# Patient Record
Sex: Female | Born: 1965 | Race: Black or African American | Hispanic: No | Marital: Single | Smoking: Former smoker
Health system: Southern US, Community
[De-identification: ages and names within clinical notes are randomized; demographics above are authoritative.]

## PROBLEM LIST (undated history)

## (undated) DIAGNOSIS — M1990 Unspecified osteoarthritis, unspecified site: Secondary | ICD-10-CM

## (undated) DIAGNOSIS — I499 Cardiac arrhythmia, unspecified: Secondary | ICD-10-CM

## (undated) DIAGNOSIS — R569 Unspecified convulsions: Secondary | ICD-10-CM

## (undated) DIAGNOSIS — E119 Type 2 diabetes mellitus without complications: Secondary | ICD-10-CM

## (undated) DIAGNOSIS — R51 Headache: Secondary | ICD-10-CM

## (undated) DIAGNOSIS — R200 Anesthesia of skin: Secondary | ICD-10-CM

## (undated) DIAGNOSIS — D329 Benign neoplasm of meninges, unspecified: Secondary | ICD-10-CM

## (undated) DIAGNOSIS — I10 Essential (primary) hypertension: Secondary | ICD-10-CM

## (undated) DIAGNOSIS — R519 Headache, unspecified: Secondary | ICD-10-CM

## (undated) DIAGNOSIS — G709 Myoneural disorder, unspecified: Secondary | ICD-10-CM

## (undated) HISTORY — PX: OTHER SURGICAL HISTORY: SHX169

---
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
## 2003-05-26 ENCOUNTER — Other Ambulatory Visit: Admission: RE | Admit: 2003-05-26 | Discharge: 2003-05-26 | Payer: Self-pay | Admitting: Obstetrics and Gynecology

## 2004-07-26 ENCOUNTER — Other Ambulatory Visit: Admission: RE | Admit: 2004-07-26 | Discharge: 2004-07-26 | Payer: Self-pay | Admitting: Obstetrics and Gynecology

## 2005-08-01 ENCOUNTER — Other Ambulatory Visit: Admission: RE | Admit: 2005-08-01 | Discharge: 2005-08-01 | Payer: Self-pay | Admitting: Obstetrics and Gynecology

## 2008-04-05 ENCOUNTER — Encounter: Admission: RE | Admit: 2008-04-05 | Discharge: 2008-04-05 | Payer: Self-pay | Admitting: Obstetrics and Gynecology

## 2009-08-29 ENCOUNTER — Encounter: Admission: RE | Admit: 2009-08-29 | Discharge: 2009-08-29 | Payer: Self-pay | Admitting: Obstetrics and Gynecology

## 2011-06-10 ENCOUNTER — Encounter (HOSPITAL_COMMUNITY): Payer: Self-pay

## 2011-06-18 ENCOUNTER — Other Ambulatory Visit: Payer: Self-pay

## 2011-06-18 ENCOUNTER — Ambulatory Visit (HOSPITAL_COMMUNITY)
Admission: RE | Admit: 2011-06-18 | Discharge: 2011-06-18 | Disposition: A | Discharge status: Home / Self Care | Payer: PRIVATE HEALTH INSURANCE | Source: Ambulatory Visit | Attending: Orthopedic Surgery | Admitting: Orthopedic Surgery

## 2011-06-18 ENCOUNTER — Encounter (HOSPITAL_COMMUNITY)
Admission: RE | Admit: 2011-06-18 | Discharge: 2011-06-18 | Disposition: A | Discharge status: Home / Self Care | Payer: PRIVATE HEALTH INSURANCE | Source: Ambulatory Visit | Attending: Orthopedic Surgery | Admitting: Orthopedic Surgery

## 2011-06-18 ENCOUNTER — Encounter (HOSPITAL_COMMUNITY): Payer: Self-pay

## 2011-06-18 DIAGNOSIS — Z01818 Encounter for other preprocedural examination: Secondary | ICD-10-CM | POA: Insufficient documentation

## 2011-06-18 DIAGNOSIS — Z01810 Encounter for preprocedural cardiovascular examination: Secondary | ICD-10-CM | POA: Insufficient documentation

## 2011-06-18 DIAGNOSIS — Z01812 Encounter for preprocedural laboratory examination: Secondary | ICD-10-CM | POA: Insufficient documentation

## 2011-06-18 HISTORY — DX: Essential (primary) hypertension: I10

## 2011-06-18 HISTORY — DX: Unspecified osteoarthritis, unspecified site: M19.90

## 2011-06-18 HISTORY — DX: Cardiac arrhythmia, unspecified: I49.9

## 2011-06-18 HISTORY — DX: Benign neoplasm of meninges, unspecified: D32.9

## 2011-06-18 LAB — URINALYSIS, ROUTINE W REFLEX MICROSCOPIC
Bilirubin Urine: NEGATIVE
Glucose, UA: NEGATIVE mg/dL
Hgb urine dipstick: NEGATIVE
Ketones, ur: NEGATIVE mg/dL
Specific Gravity, Urine: 1.025 (ref 1.005–1.030)
pH: 6.5 (ref 5.0–8.0)

## 2011-06-18 LAB — ABO/RH: ABO/RH(D): A POS

## 2011-06-18 LAB — DIFFERENTIAL
Basophils Absolute: 0 10*3/uL (ref 0.0–0.1)
Basophils Relative: 0 % (ref 0–1)
Eosinophils Absolute: 0.2 10*3/uL (ref 0.0–0.7)
Eosinophils Relative: 3 % (ref 0–5)
Lymphs Abs: 2.6 10*3/uL (ref 0.7–4.0)
Neutrophils Relative %: 47 % (ref 43–77)

## 2011-06-18 LAB — BASIC METABOLIC PANEL
Calcium: 9.1 mg/dL (ref 8.4–10.5)
Creatinine, Ser: 1.05 mg/dL (ref 0.50–1.10)
GFR calc non Af Amer: 63 mL/min — ABNORMAL LOW (ref >90)
Glucose, Bld: 101 mg/dL — ABNORMAL HIGH (ref 70–99)
Sodium: 134 mEq/L — ABNORMAL LOW (ref 135–145)

## 2011-06-18 LAB — CBC
MCH: 27.1 pg (ref 26.0–34.0)
Platelets: 247 10*3/uL (ref 150–400)
RBC: 4.61 MIL/uL (ref 3.87–5.11)
RDW: 14 % (ref 11.5–15.5)

## 2011-06-18 LAB — PROTIME-INR
INR: 0.97 (ref 0.00–1.49)
Prothrombin Time: 13.1 seconds (ref 11.6–15.2)

## 2011-06-18 LAB — SURGICAL PCR SCREEN: Staphylococcus aureus: NEGATIVE

## 2011-06-18 NOTE — Patient Instructions (Signed)
20 [name]  06/18/2011   Your procedure is scheduled on:  Monday  2/11  AT 7:30 AM  Report to [facility] at 5:30 AM.  Call this number if you have problems the morning of surgery: [phone]   Remember:   Do not eat food OR DRINK ANYTHING AFTER MIDNIGHT THE NIGHT BEFORE YOUR SURGERY.      Take these medicines the morning of surgery with A SIP OF WATER: NO MEDS TO TAKE   Do not wear jewelry, make-up or nail polish.  Do not wear lotions, powders, or perfumes.   Do not shave 48 hours prior to surgery.  Do not bring valuables to the hospital.  Contacts, dentures or bridgework may not be worn into surgery.  Leave suitcase in the car. After surgery it may be brought to your room.  For patients admitted to the hospital, checkout time is 11:00 AM the day of discharge.   Patients discharged the day of surgery will not be allowed to drive home.    Special Instructions: CHG Shower Use Special Wash: 1/2 bottle night before surgery and 1/2 bottle morning of surgery.   Please read over the following fact sheets that you were given: Blood Transfusion Information and MRSA Information AND INCENTIVE SPIROMETER INFORMATION

## 2011-06-18 NOTE — Pre-Procedure Instructions (Signed)
PT'S HEART RATE 39 AND 40 WITH DYNAMAP--AND BY RADIAL PULSE AND IRREGULAR.  PT STATES HX OF EXTRA HEARTBEAT AND SENT TO CARDIOLOGIST--NOT SURE WHEN--DID TREADMILL STRESS TEST--PT DOES NOT KNOW ANY OTHER INFORMATION--DOES NOT SEE CARDIOLOGIST ANYMORE.  EKG WAS DONE TODAY AT [facility] RATE 69 AND BIGEMINY,  B/P 156/90--PT STATES HER B/P HAS BEEN ELEVATED HER LAST 3 DOCTOR VISITS BUT SHE IS NOT ON ANY B/P MEDICATION. DR. Agustin Cree NOTIFIED OF ABOVE--STATES PT NEEDS CARDIAC CLEARANCE. [facility]-SURGERY SCHEDULER FOR DR. OLIN NOTIFIED OF ABOVE--SHE WILL LET DR. OLIN KNOW PT NEEDS CARDIAC CLEARANCE.  PT'S EKG REPORT FAXED TO [facility]. SPOKE WITH MEDICAL RECORDS PERSON AT DR. RIVARD'S OFFICE--RECORDS INDICATE PT WAS SENT TO [facility] IN 2006--IF WE NEED THOSE RECORDS-WE WOULD NEED TO CONTACT [facility].

## 2011-06-20 ENCOUNTER — Encounter: Payer: Self-pay | Admitting: *Deleted

## 2011-06-20 ENCOUNTER — Encounter: Payer: Self-pay | Admitting: Cardiovascular Disease

## 2011-06-21 ENCOUNTER — Ambulatory Visit (INDEPENDENT_AMBULATORY_CARE_PROVIDER_SITE_OTHER): Payer: PRIVATE HEALTH INSURANCE | Admitting: Cardiovascular Disease

## 2011-06-21 DIAGNOSIS — I493 Ventricular premature depolarization: Secondary | ICD-10-CM | POA: Insufficient documentation

## 2011-06-21 DIAGNOSIS — M25561 Pain in right knee: Secondary | ICD-10-CM | POA: Insufficient documentation

## 2011-06-21 DIAGNOSIS — I4949 Other premature depolarization: Secondary | ICD-10-CM

## 2011-06-21 DIAGNOSIS — M25569 Pain in unspecified knee: Secondary | ICD-10-CM

## 2011-06-21 DIAGNOSIS — Z01810 Encounter for preprocedural cardiovascular examination: Secondary | ICD-10-CM

## 2011-06-21 NOTE — Pre-Procedure Instructions (Signed)
OFFICE NOTE FROM CARDIOLOGIST DR. Eden Emms 06/21/11 IN EPIC-GIVING CLEARANCE FOR PT TO HAVE HER KNEE SURGERY WITH DR. OLIN ON 2/11.

## 2011-06-21 NOTE — Patient Instructions (Signed)
Your physician recommends that you schedule a follow-up appointment in: AS NEEDED  Your physician recommends that you continue on your current medications as directed. Please refer to the Current Medication list given to you today.  

## 2011-06-21 NOTE — Assessment & Plan Note (Signed)
Benign Normal ETT and echo per patient 3 years ago.  Asymptomatic.  Underlying exam and ECG normal.  Clinical fact that they go away with activity is reassuring

## 2011-06-21 NOTE — Assessment & Plan Note (Signed)
Clear to have TKR with Dr Charlann Boxer. Consider telemetry post op  We would be happy to follow in hospital if needed

## 2011-06-21 NOTE — Progress Notes (Signed)
46 yo referred for preop clearence.  Right TKR scheduled with Dr Charlann Boxer on Monday.  ECG with PVC;s.  Patient has long standing history of PVC;s and irregular heart beat.  ETT and Echo normal in [place] 3 years ago.  Indicates skips at rest that go away with exercise.  No history of SSCP, syncope, or dyspnea.  Hurt knee in 85 playing sports and now is severely limited.  Labs done 06/18/11 reviewed and normal  ROS: Denies fever, malais, weight loss, blurry vision, decreased visual acuity, cough, sputum, SOB, hemoptysis, pleuritic pain, palpitaitons, heartburn, abdominal pain, melena, lower extremity edema, claudication, or rash.  All other systems reviewed and negative   General: Affect appropriate Overweight black female HEENT: normal Neck supple with no adenopathy JVP normal no bruits no thyromegaly Lungs clear with no wheezing and good diaphragmatic motion Heart:  S1/S2 no murmur,rub, gallop or click PMI normal Abdomen: benighn, BS positve, no tenderness, no AAA no bruit.  No HSM or HJR Distal pulses intact with no bruits No edema Neuro non-focal Skin warm and dry No muscular weakness  Medications Current Outpatient Prescriptions  Medication Sig Dispense Refill  . Biotin 1000 MCG tablet Take 1,000 mcg by mouth daily.      Marland Kitchen BROMOCRIPTINE MESYLATE PO Take 1 tablet by mouth daily. AT NIGHT      . Cyanocobalamin (VITAMIN B-12) 1000 MCG/15ML LIQD Take 0.5 mLs by mouth daily.      Marland Kitchen FOLIC ACID PO Take 1 tablet by mouth daily.      . Multiple Vitamin (MULITIVITAMIN WITH MINERALS) TABS Take 1 tablet by mouth daily. One-a-Day Mind Active.      . naproxen sodium (ANAPROX) 220 MG tablet Take 440 mg by mouth as needed. For pain.        Allergies Review of patient's allergies indicates no known allergies.  Family History: No family history on file.  Social History: History   Social History  . Marital Status: Single    Spouse Name: N/A    Number of Children: N/A  . Years of Education:  N/A   Occupational History  . Not on file.   Social History Main Topics  . Smoking status: Former Games developer  . Smokeless tobacco: Never Used   Comment: QUIT 3 YEARS AGO-ABOUT 2008--SOCIAL SMOKER ONLY  . Alcohol Use: No  . Drug Use: No  . Sexually Active:    Other Topics Concern  . Not on file   Social History Narrative  . No narrative on file    Electrocardiogram: Sinus rhythm periods of bigeminy underlying ECG normal  Assessment and Plan

## 2011-06-21 NOTE — Assessment & Plan Note (Signed)
Clear to have surgery on Monday with no further testing

## 2011-06-23 NOTE — H&P (Signed)
[name] is an 46 y.o. female.    Chief Complaint:  Right knee pain and medial aspect OA   HPI: Pt is a 46 y.o. female complaining of right knee pain for over 2 years. Pain has continually increased since the beginning, especially the last couple of months.. X-rays in the clinic show end-stage arthritic changes of the medial aspect of the right knee. Pt has tried various conservative treatments which have failed to alleviate their symptoms, including NSAIDs. Various options are discussed with the patient. Risks, benefits and expectations were discussed with the patient. Patient understand the risks, benefits and expectations and wishes to proceed with surgery.   PCP:  No primary provider on file.  D/C Plans:  Home with HHPT  Post-op Meds:  Rx given for ASA, Robaxin, Celebrex, Iron, Colace and MiraLax  Tranexamic Acid:  To be given  Decadron:  To be given   PMH: Past Medical History  Diagnosis Date  . 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 Chrissie Noa CABELL IN  ONCE A YEAR  . Hypertension     B/P ELEVATED LAST 3 DOCTOR VISITS--BUT NO PRIOR HX AND NOT ON B/P MEDS  . Arthritis     PAIN AND OA RIGHT KNEE--ALSO IN LEFT KNEE--RIGHT PAIN  WORSE  . Dysrhythmia     PT STATES SHE HAS AN EXTRA HEART BEAT AT TIMES--FIRST NOTICED BY HER GYN  DR. Estanislado Pandy COUPLE OF YRS AGO--PT STATES SHE WAS SENT FOR STRESS TEST-TREADMILL.  -PT DOESN'T HAVE ANY OTHER INFORMATION.    PSH: Past Surgical History  Procedure Date  . Cesarean section 06/1990  . Cyst removed both hands 1997 OR 1998  . Right knee arthroscopy 1984    Social History:  reports that she has quit smoking. She has never used smokeless tobacco. She reports that she does not drink alcohol or use illicit drugs.  Allergies:  No Known Allergies  Medications: No current facility-administered  medications for this encounter.   Current Outpatient Prescriptions  Medication Sig Dispense Refill  . Biotin 1000 MCG tablet Take 1,000 mcg by mouth daily.      Marland Kitchen BROMOCRIPTINE MESYLATE PO Take 1 tablet by mouth daily. AT NIGHT      . Cyanocobalamin (VITAMIN B-12) 1000 MCG/15ML LIQD Take 0.5 mLs by mouth daily.      Marland Kitchen FOLIC ACID PO Take 1 tablet by mouth daily.      . Multiple Vitamin (MULITIVITAMIN WITH MINERALS) TABS Take 1 tablet by mouth daily. One-a-Day Mind Active.      . naproxen sodium (ANAPROX) 220 MG tablet Take 440 mg by mouth as needed. For pain.        ROS: Review of Systems  Constitutional: Negative.   HENT: Negative.   Eyes: Negative.   Respiratory: Negative.   Cardiovascular: Negative.   Gastrointestinal: Negative.   Genitourinary: Negative.   Musculoskeletal: Positive for joint pain.  Skin: Negative.   Neurological: Negative.   Endo/Heme/Allergies: Negative.   Psychiatric/Behavioral: Negative.      Physican Exam: Physical Exam  Constitutional: She is oriented to person, place, and time and well-developed, well-nourished, and in no distress.  HENT:  Head: Normocephalic and atraumatic.  Nose: Nose normal.  Mouth/Throat: Oropharynx is clear and moist.  Eyes: Pupils are equal, round, and reactive to light.  Neck: Neck supple. No JVD present. No tracheal deviation present. No thyromegaly present.  Cardiovascular:  Normal rate.   No murmur (third beat involved, has had for most f her life and been checkout previously for it) heard. Pulmonary/Chest: Effort normal and breath sounds normal. No stridor. No respiratory distress. She has no wheezes. She has no rales. She exhibits no tenderness.  Abdominal: Soft. There is no tenderness. There is no guarding.  Musculoskeletal:       Right knee: She exhibits decreased range of motion, swelling and bony tenderness. She exhibits no effusion, no ecchymosis, no deformity, no laceration and no erythema. tenderness found.    Lymphadenopathy:    She has no cervical adenopathy.  Neurological: She is alert and oriented to person, place, and time.  Skin: Skin is warm and dry.  Psychiatric: Affect normal.     Assessment/Plan Assessment:  Right knee pain and medial aspect OA   Plan: Patient will undergo a right knee medial unilateral knee replacement on 06/24/2011. Risks benefits and expectation were discussed with the patient. Patient understand risks, benefits and expectation and wishes to proceed.   Anastasio Auerbach Tahsin Benyo   PAC  06/23/2011, 5:02 PM

## 2011-06-24 ENCOUNTER — Encounter (HOSPITAL_COMMUNITY): Payer: Self-pay | Admitting: Certified Registered Nurse Anesthetist

## 2011-06-24 ENCOUNTER — Encounter (HOSPITAL_COMMUNITY)
Admission: RE | Disposition: A | Discharge status: Home / Self Care | Payer: Self-pay | Source: Ambulatory Visit | Attending: Orthopedic Surgery

## 2011-06-24 ENCOUNTER — Observation Stay (HOSPITAL_COMMUNITY)
Admission: RE | Admit: 2011-06-24 | Discharge: 2011-06-25 | Disposition: A | Discharge status: Home / Self Care | Payer: PRIVATE HEALTH INSURANCE | Source: Ambulatory Visit | Attending: Orthopedic Surgery | Admitting: Orthopedic Surgery

## 2011-06-24 ENCOUNTER — Encounter (HOSPITAL_COMMUNITY): Payer: Self-pay

## 2011-06-24 ENCOUNTER — Inpatient Hospital Stay (HOSPITAL_COMMUNITY): Payer: PRIVATE HEALTH INSURANCE | Admitting: Certified Registered Nurse Anesthetist

## 2011-06-24 DIAGNOSIS — I10 Essential (primary) hypertension: Secondary | ICD-10-CM | POA: Insufficient documentation

## 2011-06-24 DIAGNOSIS — M1710 Unilateral primary osteoarthritis, unspecified knee: Principal | ICD-10-CM | POA: Insufficient documentation

## 2011-06-24 DIAGNOSIS — I499 Cardiac arrhythmia, unspecified: Secondary | ICD-10-CM | POA: Insufficient documentation

## 2011-06-24 DIAGNOSIS — D320 Benign neoplasm of cerebral meninges: Secondary | ICD-10-CM | POA: Insufficient documentation

## 2011-06-24 DIAGNOSIS — Z79899 Other long term (current) drug therapy: Secondary | ICD-10-CM | POA: Insufficient documentation

## 2011-06-24 DIAGNOSIS — M25569 Pain in unspecified knee: Secondary | ICD-10-CM | POA: Insufficient documentation

## 2011-06-24 DIAGNOSIS — Z96651 Presence of right artificial knee joint: Secondary | ICD-10-CM

## 2011-06-24 HISTORY — PX: PARTIAL KNEE ARTHROPLASTY: SHX2174

## 2011-06-24 LAB — TYPE AND SCREEN

## 2011-06-24 SURGERY — ARTHROPLASTY, KNEE, UNICOMPARTMENTAL
Anesthesia: Spinal | Site: Knee | Laterality: Right | Wound class: Clean

## 2011-06-24 MED ORDER — FLEET ENEMA 7-19 GM/118ML RE ENEM: 1.0000 | ENEMA | Freq: Once | RECTAL | Status: AC | PRN

## 2011-06-24 MED ORDER — ATROPINE SULFATE 0.4 MG/ML IJ SOLN
INTRAMUSCULAR | Status: DC | PRN
  Administered 2011-06-24: 0.4 mg via INTRAVENOUS

## 2011-06-24 MED ORDER — BUPIVACAINE IN DEXTROSE 0.75-8.25 % IT SOLN
INTRATHECAL | Status: DC | PRN
  Administered 2011-06-24: 2 mL via INTRATHECAL

## 2011-06-24 MED ORDER — ONDANSETRON HCL 4 MG PO TABS
4.0000 mg | ORAL_TABLET | Freq: Four times a day (QID) | ORAL | Status: DC | PRN
  Administered 2011-06-25: 4 mg via ORAL
  Filled 2011-06-24: qty 1

## 2011-06-24 MED ORDER — RIVAROXABAN 10 MG PO TABS: 10.0000 mg | ORAL_TABLET | Freq: Every day | ORAL | Status: DC

## 2011-06-24 MED ORDER — DEXAMETHASONE SODIUM PHOSPHATE 10 MG/ML IJ SOLN
10.0000 mg | Freq: Once | INTRAMUSCULAR | Status: AC
  Administered 2011-06-24: 10 mg via INTRAVENOUS

## 2011-06-24 MED ORDER — FENTANYL CITRATE 0.05 MG/ML IJ SOLN
INTRAMUSCULAR | Status: DC | PRN
  Administered 2011-06-24 (×5): 50 ug via INTRAVENOUS

## 2011-06-24 MED ORDER — TRANEXAMIC ACID 100 MG/ML IV SOLN
15.0000 mg/kg | Freq: Once | INTRAVENOUS | Status: AC
  Administered 2011-06-24: 1605 mg via INTRAVENOUS
  Filled 2011-06-24: qty 16.05

## 2011-06-24 MED ORDER — PHENOL 1.4 % MT LIQD
1.0000 | OROMUCOSAL | Status: DC | PRN
  Filled 2011-06-24: qty 177

## 2011-06-24 MED ORDER — KETOROLAC TROMETHAMINE 30 MG/ML IJ SOLN
INTRAMUSCULAR | Status: DC | PRN
  Administered 2011-06-24: 30 mg

## 2011-06-24 MED ORDER — LIDOCAINE HCL (CARDIAC) 20 MG/ML IV SOLN
INTRAVENOUS | Status: DC | PRN
  Administered 2011-06-24: 80 mg via INTRAVENOUS

## 2011-06-24 MED ORDER — ONDANSETRON HCL 4 MG/2ML IJ SOLN
4.0000 mg | Freq: Four times a day (QID) | INTRAMUSCULAR | Status: DC | PRN
  Filled 2011-06-24: qty 2

## 2011-06-24 MED ORDER — FERROUS SULFATE 325 (65 FE) MG PO TABS
325.0000 mg | ORAL_TABLET | Freq: Three times a day (TID) | ORAL | Status: DC
  Administered 2011-06-24 – 2011-06-25 (×2): 325 mg via ORAL
  Filled 2011-06-24 (×4): qty 1

## 2011-06-24 MED ORDER — ZOLPIDEM TARTRATE 5 MG PO TABS: 5.0000 mg | ORAL_TABLET | Freq: Every evening | ORAL | Status: DC | PRN

## 2011-06-24 MED ORDER — MENTHOL 3 MG MT LOZG
1.0000 | LOZENGE | OROMUCOSAL | Status: DC | PRN
  Filled 2011-06-24: qty 9

## 2011-06-24 MED ORDER — DOCUSATE SODIUM 100 MG PO CAPS
100.0000 mg | ORAL_CAPSULE | Freq: Two times a day (BID) | ORAL | Status: DC
  Administered 2011-06-24: 100 mg via ORAL
  Filled 2011-06-24 (×3): qty 1

## 2011-06-24 MED ORDER — 0.9 % SODIUM CHLORIDE (POUR BTL) OPTIME
TOPICAL | Status: DC | PRN
  Administered 2011-06-24: 1000 mL

## 2011-06-24 MED ORDER — CEFAZOLIN SODIUM-DEXTROSE 2-3 GM-% IV SOLR
2.0000 g | Freq: Four times a day (QID) | INTRAVENOUS | Status: AC
  Administered 2011-06-24 – 2011-06-25 (×3): 2 g via INTRAVENOUS
  Filled 2011-06-24 (×3): qty 50

## 2011-06-24 MED ORDER — BISACODYL 5 MG PO TBEC: 5.0000 mg | DELAYED_RELEASE_TABLET | Freq: Every day | ORAL | Status: DC | PRN

## 2011-06-24 MED ORDER — HYDROMORPHONE HCL PF 1 MG/ML IJ SOLN: 0.2500 mg | INTRAMUSCULAR | Status: DC | PRN

## 2011-06-24 MED ORDER — GLYCOPYRROLATE 0.2 MG/ML IJ SOLN
INTRAMUSCULAR | Status: DC | PRN
  Administered 2011-06-24: 0.2 mg via INTRAVENOUS

## 2011-06-24 MED ORDER — METHOCARBAMOL 100 MG/ML IJ SOLN
500.0000 mg | Freq: Four times a day (QID) | INTRAMUSCULAR | Status: DC | PRN
  Administered 2011-06-24: 500 mg via INTRAVENOUS
  Filled 2011-06-24: qty 5

## 2011-06-24 MED ORDER — HYDROMORPHONE HCL PF 1 MG/ML IJ SOLN
0.5000 mg | INTRAMUSCULAR | Status: DC | PRN
  Administered 2011-06-24 (×3): 0.5 mg via INTRAVENOUS
  Filled 2011-06-24 (×3): qty 1

## 2011-06-24 MED ORDER — LACTATED RINGERS IV SOLN
INTRAVENOUS | Status: DC | PRN
  Administered 2011-06-24 (×2): via INTRAVENOUS

## 2011-06-24 MED ORDER — PHENYLEPHRINE HCL 10 MG/ML IJ SOLN
INTRAMUSCULAR | Status: DC | PRN
  Administered 2011-06-24: 20 ug via INTRAVENOUS
  Administered 2011-06-24: 10 ug via INTRAVENOUS

## 2011-06-24 MED ORDER — ACETAMINOPHEN 10 MG/ML IV SOLN
INTRAVENOUS | Status: DC | PRN
  Administered 2011-06-24: 1000 mg via INTRAVENOUS

## 2011-06-24 MED ORDER — PROMETHAZINE HCL 25 MG/ML IJ SOLN: 6.2500 mg | INTRAMUSCULAR | Status: DC | PRN

## 2011-06-24 MED ORDER — POLYETHYLENE GLYCOL 3350 17 G PO PACK
17.0000 g | PACK | Freq: Two times a day (BID) | ORAL | Status: DC
  Administered 2011-06-24: 17 g via ORAL
  Filled 2011-06-24 (×3): qty 1

## 2011-06-24 MED ORDER — CEFAZOLIN SODIUM-DEXTROSE 2-3 GM-% IV SOLR
2.0000 g | Freq: Once | INTRAVENOUS | Status: AC
  Administered 2011-06-24: 2 g via INTRAVENOUS

## 2011-06-24 MED ORDER — HYDROCODONE-ACETAMINOPHEN 7.5-325 MG PO TABS
1.0000 | ORAL_TABLET | ORAL | Status: DC
  Administered 2011-06-24 (×2): 2 via ORAL
  Administered 2011-06-24: 1 via ORAL
  Administered 2011-06-25 (×3): 2 via ORAL
  Filled 2011-06-24 (×4): qty 2
  Filled 2011-06-24: qty 1
  Filled 2011-06-24: qty 2

## 2011-06-24 MED ORDER — METHOCARBAMOL 500 MG PO TABS
500.0000 mg | ORAL_TABLET | Freq: Four times a day (QID) | ORAL | Status: DC | PRN
  Administered 2011-06-24 – 2011-06-25 (×2): 500 mg via ORAL
  Filled 2011-06-24 (×2): qty 1

## 2011-06-24 MED ORDER — METOCLOPRAMIDE HCL 10 MG PO TABS: 5.0000 mg | ORAL_TABLET | Freq: Three times a day (TID) | ORAL | Status: DC | PRN

## 2011-06-24 MED ORDER — ACETAMINOPHEN 325 MG PO TABS: 650.0000 mg | ORAL_TABLET | Freq: Four times a day (QID) | ORAL | Status: DC | PRN

## 2011-06-24 MED ORDER — RIVAROXABAN 10 MG PO TABS
10.0000 mg | ORAL_TABLET | Freq: Once | ORAL | Status: AC
  Administered 2011-06-25: 10 mg via ORAL
  Filled 2011-06-24: qty 1

## 2011-06-24 MED ORDER — CHLORHEXIDINE GLUCONATE 4 % EX LIQD: 60.0000 mL | Freq: Once | CUTANEOUS | Status: DC

## 2011-06-24 MED ORDER — MIDAZOLAM HCL 5 MG/5ML IJ SOLN
INTRAMUSCULAR | Status: DC | PRN
  Administered 2011-06-24: 2 mg via INTRAVENOUS

## 2011-06-24 MED ORDER — DIPHENHYDRAMINE HCL 25 MG PO CAPS: 25.0000 mg | ORAL_CAPSULE | Freq: Four times a day (QID) | ORAL | Status: DC | PRN

## 2011-06-24 MED ORDER — SODIUM CHLORIDE 0.9 % IV SOLN
INTRAVENOUS | Status: DC
  Administered 2011-06-24 (×2): via INTRAVENOUS
  Filled 2011-06-24 (×6): qty 1000

## 2011-06-24 MED ORDER — RIVAROXABAN 10 MG PO TABS
10.0000 mg | ORAL_TABLET | ORAL | Status: DC
  Filled 2011-06-24: qty 1

## 2011-06-24 MED ORDER — LACTATED RINGERS IV SOLN: INTRAVENOUS | Status: DC

## 2011-06-24 MED ORDER — ACETAMINOPHEN 650 MG RE SUPP: 650.0000 mg | Freq: Four times a day (QID) | RECTAL | Status: DC | PRN

## 2011-06-24 MED ORDER — BUPIVACAINE-EPINEPHRINE 0.25% -1:200000 IJ SOLN
INTRAMUSCULAR | Status: DC | PRN
  Administered 2011-06-24: 30 mL

## 2011-06-24 MED ORDER — METOCLOPRAMIDE HCL 5 MG/ML IJ SOLN: 5.0000 mg | Freq: Three times a day (TID) | INTRAMUSCULAR | Status: DC | PRN

## 2011-06-24 MED ORDER — PROPOFOL 10 MG/ML IV EMUL
INTRAVENOUS | Status: DC | PRN
  Administered 2011-06-24: 25 ug/kg/min via INTRAVENOUS

## 2011-06-24 MED ORDER — DEXAMETHASONE SODIUM PHOSPHATE 10 MG/ML IJ SOLN
10.0000 mg | Freq: Once | INTRAMUSCULAR | Status: AC
  Administered 2011-06-25: 10 mg via INTRAVENOUS
  Filled 2011-06-24: qty 1

## 2011-06-24 MED ORDER — ALUM & MAG HYDROXIDE-SIMETH 200-200-20 MG/5ML PO SUSP: 30.0000 mL | ORAL | Status: DC | PRN

## 2011-06-24 SURGICAL SUPPLY — 56 items
ADH SKN CLS APL DERMABOND .7 (GAUZE/BANDAGES/DRESSINGS) ×1
BAG SPEC THK2 15X12 ZIP CLS (MISCELLANEOUS) ×1
BAG ZIPLOCK 12X15 (MISCELLANEOUS) ×2 IMPLANT
BANDAGE ELASTIC 6 VELCRO ST LF (GAUZE/BANDAGES/DRESSINGS) ×2 IMPLANT
BANDAGE ESMARK 6X9 LF (GAUZE/BANDAGES/DRESSINGS) ×1 IMPLANT
BLADE SAW RECIPROCATING 77.5 (BLADE) ×2 IMPLANT
BLADE SAW SGTL 13.0X1.19X90.0M (BLADE) ×2 IMPLANT
BNDG CMPR 9X6 STRL LF SNTH (GAUZE/BANDAGES/DRESSINGS) ×1
BNDG ESMARK 6X9 LF (GAUZE/BANDAGES/DRESSINGS) ×2
BOWL SMART MIX CTS (DISPOSABLE) ×2 IMPLANT
CEMENT HV SMART SET (Cement) ×2 IMPLANT
CLOTH BEACON ORANGE TIMEOUT ST (SAFETY) ×2 IMPLANT
COVER SURGICAL LIGHT HANDLE (MISCELLANEOUS) ×2 IMPLANT
CUFF TOURN SGL QUICK 34 (TOURNIQUET CUFF) ×2
CUFF TRNQT CYL 34X4X40X1 (TOURNIQUET CUFF) ×1 IMPLANT
DERMABOND ADVANCED (GAUZE/BANDAGES/DRESSINGS) ×1
DERMABOND ADVANCED .7 DNX12 (GAUZE/BANDAGES/DRESSINGS) ×1 IMPLANT
DRAPE EXTREMITY T 121X128X90 (DRAPE) ×2 IMPLANT
DRAPE POUCH INSTRU U-SHP 10X18 (DRAPES) ×2 IMPLANT
DRSG AQUACEL AG ADV 3.5X 6 (GAUZE/BANDAGES/DRESSINGS) ×2 IMPLANT
DRSG AQUACEL AG ADV 3.5X14 (GAUZE/BANDAGES/DRESSINGS) ×1 IMPLANT
DRSG TEGADERM 4X4.75 (GAUZE/BANDAGES/DRESSINGS) ×2 IMPLANT
DURAPREP 26ML APPLICATOR (WOUND CARE) ×2 IMPLANT
ELECT REM PT RETURN 9FT ADLT (ELECTROSURGICAL) ×2
ELECTRODE REM PT RTRN 9FT ADLT (ELECTROSURGICAL) ×1 IMPLANT
EVACUATOR 1/8 PVC DRAIN (DRAIN) ×2 IMPLANT
FACESHIELD LNG OPTICON STERILE (SAFETY) ×8 IMPLANT
GAUZE SPONGE 2X2 8PLY STRL LF (GAUZE/BANDAGES/DRESSINGS) ×1 IMPLANT
GLOVE BIOGEL PI IND STRL 7.5 (GLOVE) ×1 IMPLANT
GLOVE BIOGEL PI IND STRL 8 (GLOVE) IMPLANT
GLOVE BIOGEL PI INDICATOR 7.5 (GLOVE) ×1
GLOVE BIOGEL PI INDICATOR 8 (GLOVE)
GLOVE INDICATOR 6.5 STRL GRN (GLOVE) ×1 IMPLANT
GLOVE ORTHO TXT STRL SZ7.5 (GLOVE) ×4 IMPLANT
GLOVE SURG SS PI 6.5 STRL IVOR (GLOVE) ×1 IMPLANT
GLOVE SURG SS PI 8.5 STRL IVOR (GLOVE) ×2
GLOVE SURG SS PI 8.5 STRL STRW (GLOVE) IMPLANT
GOWN BRE IMP PREV XXLGXLNG (GOWN DISPOSABLE) ×3 IMPLANT
GOWN STRL NON-REIN LRG LVL3 (GOWN DISPOSABLE) ×3 IMPLANT
IMMOBILIZER KNEE 20 (SOFTGOODS) ×2
IMMOBILIZER KNEE 20 THIGH 36 (SOFTGOODS) IMPLANT
KIT BASIN OR (CUSTOM PROCEDURE TRAY) ×2 IMPLANT
MANIFOLD NEPTUNE II (INSTRUMENTS) ×2 IMPLANT
NDL SAFETY ECLIPSE 18X1.5 (NEEDLE) ×1 IMPLANT
NEEDLE HYPO 18GX1.5 SHARP (NEEDLE) ×2
PACK TOTAL JOINT (CUSTOM PROCEDURE TRAY) ×2 IMPLANT
POSITIONER SURGICAL ARM (MISCELLANEOUS) ×2 IMPLANT
SPONGE GAUZE 2X2 STER 10/PKG (GAUZE/BANDAGES/DRESSINGS) ×1
SUCTION FRAZIER TIP 10 FR DISP (SUCTIONS) ×2 IMPLANT
SUT MNCRL AB 4-0 PS2 18 (SUTURE) ×2 IMPLANT
SUT VIC AB 1 CT1 36 (SUTURE) ×4 IMPLANT
SUT VIC AB 2-0 CT1 27 (SUTURE) ×4
SUT VIC AB 2-0 CT1 TAPERPNT 27 (SUTURE) ×2 IMPLANT
SYR 50ML LL SCALE MARK (SYRINGE) ×2 IMPLANT
TOWEL OR 17X26 10 PK STRL BLUE (TOWEL DISPOSABLE) ×4 IMPLANT
TRAY FOLEY CATH 14FRSI W/METER (CATHETERS) ×2 IMPLANT

## 2011-06-24 NOTE — Interval H&P Note (Signed)
History and Physical Interval Note:  06/24/2011 7:08 AM  [name]  has presented today for surgery, with the diagnosis of Right Knee Medial Osteoarthritis  The various methods of treatment have been discussed with the patient and family. After consideration of risks, benefits and other options for treatment, the patient has consented to  Procedure(s): RIGHT UNICOMPARTMENTAL KNEE as a surgical intervention .  The patients' history has been reviewed, patient examined, no change in status, stable for surgery.  I have reviewed the patients' chart and labs.  Questions were answered to the patient's satisfaction.     [name]

## 2011-06-24 NOTE — Transfer of Care (Signed)
Immediate Anesthesia Transfer of Care Note  Patient: [name]  Procedure(s) Performed:  UNICOMPARTMENTAL KNEE  Patient Location: PACU  Anesthesia Type: Regional  Level of Consciousness: awake, alert  and oriented  Airway & Oxygen Therapy: Patient Spontanous Breathing and Patient connected to face mask oxygen  Post-op Assessment: Report given to PACU RN  Post vital signs: Reviewed and stable  Complications: No apparent anesthesia complications

## 2011-06-24 NOTE — Op Note (Signed)
NAME: [name]    MEDICAL RECORD NO.: [id number]   FACILITY: [facility]   DATE OF BIRTH: 1965/11/23  PHYSICIAN: Madlyn Frankel. Charlann Boxer, M.D.    DATE OF PROCEDURE: 06/24/2011    OPERATIVE REPORT   PREOPERATIVE DIAGNOSIS: Right knee medial compartment osteoarthritis.   POSTOPERATIVE DIAGNOSIS: Right knee medial compartment osteoarthritis.  PROCEDURE: Right partial knee replacement utilizing Biomet Oxford knee  component, size small femur, a size A right medial  tibial tray with a 4mm right small insert.   SURGEON: Madlyn Frankel. Charlann Boxer, M.D.   ASSISTANT: Lanney Gins, PAC.  Please note that [name] was present for the entirety of the case,  utilized for preoperative positioning, perioperative retractor  management, general facilitation of the case and primary wound closure.   ANESTHESIA: Spinal.   SPECIMENS: None.   COMPLICATIONS: None.  DRAINS: 1 medium HV   TOURNIQUET TIME: 52 minutes at 250 mmHg.   INDICATIONS FOR PROCEDURE: The patient is a 46yo female patient of mine who presented for evaluation of right knee pain.  They presented with primary complaints of pain on the medial side of their knee. Radiographs revealed advanced medial compartment arthritis with specifically an antero-medial wear pattern.  There was bone on bone changes noted with subchondral sclerosis and osteophytes present. The patient has had progressive problems failing to respond to conservative measures of medications, injections and activity modification. Risks of infection, DVT, component failure, need for future revision surgery were all discussed and reviewed.  Consent was obtained for benefit of pain relief.   PROCEDURE IN DETAIL: The patient was brought to the operative theater.  Once adequate anesthesia, preoperative antibiotics, 2gm Ancef administered, the patient was positioned in supine position with a right thigh tourniquet  placed. The right lower extremity was prepped and draped in sterile  fashion  with the leg on the Oxford leg holder.  The leg was allowed to flex to 120 degrees. A time-out  was performed identifying the patient, planned procedure, and extremity.  The leg was exsanguinated, tourniquet elevated to 250 mmHg. A midline  incision was made from the proximal pole of the patella to the tibial tubercle. A  soft tissue plane was created and partial median arthrotomy was then  made to allow for subluxation of the patella. Following initial synovectomy and  debridement, the osteophytes were removed off the medial aspect of the  knee.   Attention was first directed to the tibia. The tibial  extramedullary guide was positioned over the anterior crest of the tibia  and pinned into position, and using a measured resection guide from the  Oxford system, a 4 mm resection was made off the proximal tibia. First  the reciprocating saw along the medial aspect of the tibial spines, then the oscillating saw.    At this point, I sized this cut surface seem to be best fit for a size small tibial tray.  With the retractors out of the wound and the knee held at 90 degrees initially the 3 feeler gauge had appropriate tension on the medial ligament.   At this point, the femoral canal was opened with a drill and the  intramedullary rod passed. Then using the guide for a 4 mm resection off  the posterior aspect of the femur was positioned over the mid portion of the medial femoral condyle.  The orientation was set using the guide that mates the femoral guide to the intramedullary rod.  The 2 drill holes were made into the distal femur.  The  posterior guide was then impacted into place and the posterior  femoral cut made.  At this point, I milled the distal femur with a size 4 spigot in place. At this point, we did a trial reduction of the small femur, size A tibial tray and a 4mm insert. At 90 degrees of  flexion and at 20 degrees of flexion the knee had symmetric tension on  the ligaments.    Given these findings, the trial femoral component was removed. Final preparation of tibia was carried out by pinning it in position. Then  using a reciprocating saw I removed bone for the keel. Further bone was  removed with an osteotome.  Trial reduction was now carried out with the small femur, the size A right medial tibia tray with a keel, and a 4 lollipop insert. The balance of the  ligaments appeared to be symmetric at 20 degrees and 90 degrees. Given  all these findings, the trial components were removed.   Cement was mixed. The final components were opened. The knee was irrigated with  normal saline solution. Then final debridements of the  soft tissue was carried out, I also drilled the sclerotic bone with a drill.  The final components were cemented with a single batch of cement in a  two-stage technique with the tibial component cemented first. The knee  was then brought  to 45 degrees of flexion with a 4 feeler gauge, held with pressure for a minute and half.  After this the femoral component was cemented in place.  The knee was again held at 45 degrees of flexion while the cement fully cured.  Excess cement was removed throughout the knee. Tourniquet was let down  after 52 minutes. After the cement had fully cured and excessive cement  was removed throughout the knee there was no visualized cement present.   The final 4 mm was chosen and snapped into position. We re-irrigated  the knee. I placed a medium Hemovac drain deep. The extensor mechanism  was then reapproximated using a #1 Vicryl with the knee in flexion. The  remaining wound was closed with 2-0 Vicryl and a running 4-0 Monocryl.  The knee was cleaned, dried, and dressed sterilely using Dermabond and  Aquacel dressing. The drain site was dressed separately. The patient  was brought to the recovery room, Ace wrap in place, tolerating the  procedure well. He will be in the hospital for overnight observation.  We  will initiate physical therapy and progress to ambulate.     Madlyn Frankel Charlann Boxer, M.D.

## 2011-06-24 NOTE — Anesthesia Postprocedure Evaluation (Signed)
  Anesthesia Post-op Note  Patient: [name]  Procedure(s) Performed:  UNICOMPARTMENTAL KNEE  Patient Location: PACU  Anesthesia Type: Spinal  Level of Consciousness: awake and alert   Airway and Oxygen Therapy: Patient Spontanous Breathing  Post-op Pain: mild  Post-op Assessment: Post-op Vital signs reviewed, Patient's Cardiovascular Status Stable, Respiratory Function Stable, Patent Airway and No signs of Nausea or vomiting  Post-op Vital Signs: stable  Complications: No apparent anesthesia complications. Spinal has regressed more than three levels.

## 2011-06-24 NOTE — Anesthesia Procedure Notes (Signed)
Spinal  Patient location during procedure: OR Staffing Performed by: anesthesiologist  Preanesthetic Checklist Completed: patient identified, site marked, surgical consent, pre-op evaluation, timeout performed, IV checked, risks and benefits discussed and monitors and equipment checked Spinal Block Patient position: sitting Prep: Betadine Patient monitoring: heart rate, continuous pulse ox and blood pressure Approach: midline Location: L3-4 Injection technique: single-shot Needle Needle type: Sprotte  Needle gauge: 24 G Needle length: 9 cm Additional Notes Expiration date of kit checked and confirmed. Patient tolerated procedure well, without complications. No paresthesia. No heme in CSF

## 2011-06-24 NOTE — Anesthesia Preprocedure Evaluation (Signed)
Anesthesia Evaluation  Patient identified by MRN, date of birth, ID band Patient awake    Reviewed: Allergy & Precautions, H&P , NPO status , Patient's Chart, lab work & pertinent test results  Airway Mallampati: II TM Distance: >3 FB Neck ROM: Full    Dental No notable dental hx.    Pulmonary former smoker clear to auscultation  Pulmonary exam normal       Cardiovascular hypertension, + dysrhythmias Regular Normal PVCs on EKG   Neuro/Psych Negative Neurological ROS  Negative Psych ROS   GI/Hepatic negative GI ROS, Neg liver ROS,   Endo/Other  Negative Endocrine ROS  Renal/GU negative Renal ROS  Genitourinary negative   Musculoskeletal negative musculoskeletal ROS (+)   Abdominal (+) obese,   Peds negative pediatric ROS (+)  Hematology negative hematology ROS (+)   Anesthesia Other Findings   Reproductive/Obstetrics negative OB ROS                           Anesthesia Physical Anesthesia Plan  ASA: II  Anesthesia Plan: Spinal   Post-op Pain Management:    Induction: Intravenous  Airway Management Planned:   Additional Equipment:   Intra-op Plan:   Post-operative Plan: Extubation in OR  Informed Consent: I have reviewed the patients History and Physical, chart, labs and discussed the procedure including the risks, benefits and alternatives for the proposed anesthesia with the patient or authorized representative who has indicated his/her understanding and acceptance.   Dental advisory given  Plan Discussed with: CRNA  Anesthesia Plan Comments: (Discussed risks/benefits of spinal including headache, backache, failure, bleeding, infection, and nerve damage. Patient consents to spinal. Questions answered. Coagulation studies and platelet count acceptable. )        Anesthesia Quick Evaluation

## 2011-06-25 LAB — CBC
HCT: 35.1 % — ABNORMAL LOW (ref 36.0–46.0)
Hemoglobin: 11.4 g/dL — ABNORMAL LOW (ref 12.0–15.0)
MCHC: 32.5 g/dL (ref 30.0–36.0)
MCV: 85.2 fL (ref 78.0–100.0)
RDW: 13.7 % (ref 11.5–15.5)

## 2011-06-25 LAB — BASIC METABOLIC PANEL
BUN: 10 mg/dL (ref 6–23)
Creatinine, Ser: 0.83 mg/dL (ref 0.50–1.10)
GFR calc Af Amer: >90 mL/min (ref >90)
GFR calc non Af Amer: 84 mL/min — ABNORMAL LOW (ref >90)
Glucose, Bld: 184 mg/dL — ABNORMAL HIGH (ref 70–99)

## 2011-06-25 MED ORDER — FERROUS SULFATE 325 (65 FE) MG PO TABS: 325.0000 mg | ORAL_TABLET | Freq: Three times a day (TID) | ORAL | Status: DC

## 2011-06-25 MED ORDER — POLYETHYLENE GLYCOL 3350 17 G PO PACK: 17.0000 g | PACK | Freq: Two times a day (BID) | ORAL | Status: AC

## 2011-06-25 MED ORDER — ASPIRIN EC 325 MG PO TBEC: 325.0000 mg | DELAYED_RELEASE_TABLET | Freq: Two times a day (BID) | ORAL | Status: AC

## 2011-06-25 MED ORDER — HYDROCODONE-ACETAMINOPHEN 7.5-325 MG PO TABS: 1.0000 | ORAL_TABLET | ORAL | Status: AC

## 2011-06-25 MED ORDER — DIPHENHYDRAMINE HCL 25 MG PO CAPS: 25.0000 mg | ORAL_CAPSULE | Freq: Four times a day (QID) | ORAL | Status: AC | PRN

## 2011-06-25 MED ORDER — METHOCARBAMOL 500 MG PO TABS: 500.0000 mg | ORAL_TABLET | Freq: Four times a day (QID) | ORAL | Status: AC | PRN

## 2011-06-25 MED ORDER — DSS 100 MG PO CAPS: 100.0000 mg | ORAL_CAPSULE | Freq: Two times a day (BID) | ORAL | Status: AC

## 2011-06-25 NOTE — Progress Notes (Signed)
Subjective: 1 Day Post-Op Procedure(s) (LRB): UNICOMPARTMENTAL KNEE (Right)   Patient reports pain as mild. Feels good this morning. No events. Ready to go home with home health.  Objective:   VITALS:   Filed Vitals:   06/25/11 0434  BP: 161/80  Pulse: 40  Temp: 97.6 F (36.4 C)  Resp: 18    Neurovascular intact Dorsiflexion/Plantar flexion intact Incision: dressing C/D/I No cellulitis present Compartment soft  LABS  Basename 06/25/11 0425  HGB 11.4*  HCT 35.1*  WBC 12.3*  PLT 213     Basename 06/25/11 0425  NA 137  K 4.1  BUN 10  CREATININE 0.83  GLUCOSE 184*     Assessment/Plan: 1 Day Post-Op Procedure(s) (LRB): UNICOMPARTMENTAL KNEE (Right)  Foley d/c'ed HV drain d/c'ed Advance diet Up with therapy D/C IV fluids Discharge home with home health   Anastasio Auerbach. [place]   PAC  06/25/2011, 7:50 AM

## 2011-06-25 NOTE — Progress Notes (Signed)
  CARE MANAGEMENT NOTE 06/25/2011  Patient:  [name],[name]   Account Number:  [id number]  Date Initiated:  06/25/2011  Documentation initiated by:  Colleen Can  Subjective/Objective Assessment:   dx right medial osteoarthritis; rt partial knee repalcemnt     Action/Plan:   CM spoke with patient and plans are for patient to return to her home in [place], [place] where her sister will be caregiver. She will need RW and HH pt   Anticipated DC Date:  06/25/2011   Anticipated DC Plan:  HOME W HOME HEALTH SERVICES  In-house referral  NA      DC Planning Services  CM consult  DC out of service area      [facility] Choice  HOME HEALTH  DURABLE MEDICAL EQUIPMENT   Choice offered to / List presented to:  C-1 Patient   DME arranged  [facility]      DME agency  OTHER - SEE NOTE     HH arranged  HH-2 PT      HH agency  OTHER - SEE NOTE   Status of service:  Completed, signed off Medicare Important Message given?  NO (If response is "NO", the following Medicare IM given date fields will be blank) Date Medicare IM given:   Date Additional Medicare IM given:    Discharge Disposition:  HOME W HOME HEALTH SERVICES  Per UR Regulation:    Comments:  06/25/2011 Raynelle Bring BSN CCM [phone] Offered home health choice for hh services. Pt requesting [facility] in [place], [place]. Intake personnel-April states they are in network with patient's insurance and can arrange for RW to be delivered to patient's home, Adrres and phone number of patient verified. Contact for [facility] [phone] fax # [phone]/faxed face sheet, dme-order for rw, order for HHpt. H&P. Op note, D/c note with confirmation. Pt discharged to home with 1 crutch supplied by PT and has access to walker til hers is delivered to her home.[facility] will call patient to arrange for [facility] and dme . Staret of services tomorrow 06/26/2011

## 2011-06-25 NOTE — Progress Notes (Addendum)
OT Screen Order received, chart reviewed. Pt reports having no difficulty dressing, bathing or transferring from standard height commode. States family will be home to assist her. Pt presents with no OT needs at this time. Will sign off.  Garrel Ridgel, OTR/L  Pager [phone] 06/25/2011

## 2011-06-25 NOTE — Evaluation (Signed)
Physical Therapy Evaluation Patient Details Name: [name] MRN: [id number] DOB: 08-19-1965 Today's Date: 06/25/2011  Problem List:  Patient Active Problem List  Diagnoses  . Preop cardiovascular exam  . PVC (premature ventricular contraction)  . Right knee pain  . S/P right unicompartmental knee replacement    Past Medical History:  Past Medical History  Diagnosis Date  . 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 Chrissie Noa CABELL IN [place] ONCE A YEAR  . Hypertension     B/P ELEVATED LAST 3 DOCTOR VISITS--BUT NO PRIOR HX AND NOT ON B/P MEDS  . Arthritis     PAIN AND OA RIGHT KNEE--ALSO IN LEFT KNEE--RIGHT PAIN  WORSE  . Dysrhythmia     PT STATES SHE HAS AN EXTRA HEART BEAT AT TIMES--FIRST NOTICED BY HER GYN  DR. Estanislado Pandy COUPLE OF YRS AGO--PT STATES SHE WAS SENT FOR STRESS TEST-TREADMILL.  -PT DOESN'T HAVE ANY OTHER INFORMATION.   Past Surgical History:  Past Surgical History  Procedure Date  . Cesarean section 06/1990  . Cyst removed both hands 1997 OR 1998  . Right knee arthroscopy 1984    PT Assessment/Plan/Recommendation PT Assessment Clinical Impression Statement: Patient s/p right unicompartmental knee arthroscopy presents with decreased strength and AROM right LE with acute pain all limiting independence with mobility.  She will have necessary level of assistance on discharge and will need HHPT follow up with d/c planned for today. PT Recommendation/Assessment: Patent does not need any further PT services No Skilled PT: All education completed;Patient will have necessary level of assist by caregiver at discharge PT Recommendation Follow Up Recommendations: Home health PT Equipment Recommended: Rolling walker with 5" wheels PT Goals     PT Evaluation Precautions/Restrictions  Precautions Precautions: Knee Required Braces or  Orthoses: Yes Knee Immobilizer: Discontinue once straight leg raise with < 10 degree lag Restrictions Weight Bearing Restrictions: Yes RLE Weight Bearing: Weight bearing as tolerated Prior Functioning  Home Living Lives With: Family (sister) Type of Home: House (townhouse) Home Layout: Two level;Bed/bath upstairs Alternate Level Stairs-Rails: Right;Left Alternate Level Stairs-Number of Steps: 12 Home Access: Level entry (curb) Home Adaptive Equipment: Walker - rolling Additional Comments: has mother's walker in the car, not sure if has wheels or not Prior Function Level of Independence: Independent with transfers;Independent with basic ADLs;Independent with homemaking with ambulation;Independent with gait Cognition Cognition Arousal/Alertness: Awake/alert Overall Cognitive Status: Appears within functional limits for tasks assessed Orientation Level: Oriented X4 Sensation/Coordination   Extremity Assessment RLE Assessment RLE Assessment: Exceptions to [facility] RLE AROM (degrees) RLE Overall AROM Comments: ankle WFL, knee flextion approx 40* AAROM, extension 4-5* from neutral RLE Strength RLE Overall Strength Comments: positive SLR with knee control. ankle WFL LLE Assessment LLE Assessment: Within Functional Limits Mobility (including Balance) Bed Mobility Bed Mobility: Yes Supine to Sit: 4: Min assist Supine to Sit Details (indicate cue type and reason): for right LE Sitting - Scoot to Edge of Bed: 4: Min assist Sitting - Scoot to [place] of Bed Details (indicate cue type and reason): for right LE Transfers Transfers: Yes Sit to Stand: 4: Min assist;With upper extremity assist;From chair/3-in-1;From bed Sit to Stand Details (indicate cue type and reason): cues for technique Stand to Sit: 4: Min assist;To chair/3-in-1;With armrests Stand to Sit Details: cues for technique Ambulation/Gait Ambulation/Gait: Yes Ambulation/Gait Assistance: 5: Supervision Ambulation/Gait Assistance  Details (indicate cue type and reason): cues for posture and  technique Ambulation Distance (Feet): 400 Feet (with seated rest) Assistive device: Rolling walker Gait Pattern: Step-through pattern Stairs: Yes Stairs Assistance: 4: Min assist Stairs Assistance Details (indicate cue type and reason): demonstrated first with crutch and rail technique and patient performed with sister assisting and cues to both for technique.  Handout given.   Stair Management Technique: Step to pattern;One rail Right;With crutches Number of Stairs: 5     Exercise  Total Joint Exercises Ankle Circles/Pumps: AROM;Both;10 reps;Supine Quad Sets: AROM;Right;10 reps;Supine Short Arc Quad: AROM;10 reps;Supine Heel Slides: AAROM;Right;10 reps;Supine Hip ABduction/ADduction: AROM;Right;10 reps;Supine Straight Leg Raises: AROM;Right;10 reps;Supine End of Session PT - End of Session Equipment Utilized During Treatment: Gait belt;Right knee immobilizer Activity Tolerance: Patient tolerated treatment well Patient left: in chair;with call bell in reach;with family/visitor present General Behavior During Session: [facility] for tasks performed Cognition: [facility] for tasks performed  [facility] 06/25/2011, 12:18 PM

## 2011-06-25 NOTE — Discharge Summary (Signed)
Physician Discharge Summary  Patient ID: [name] MRN: [id number] DOB/AGE: November 20, 1965 46 y.o.  Admit date: 06/24/2011 Discharge date: 06/25/2011  Procedures:  Procedure(s) (LRB): UNICOMPARTMENTAL KNEE (Right)  Attending Physician: [name], MD   Admission Diagnoses: Right knee pain and medial aspect OA     Discharge Diagnoses:  Principal Problem:  *S/P right unicompartmental knee replacement Meningioma   Hypertension  Arthritis  Dysrhythmia   HPI: Pt is a 46 y.o. female complaining of right knee pain for over 2 years. Pain has continually increased since the beginning, especially the last couple of months.. X-rays in the clinic show end-stage arthritic changes of the medial aspect of the right knee. Pt has tried various conservative treatments which have failed to alleviate their symptoms, including NSAIDs. Various options are discussed with the patient. Risks, benefits and expectations were discussed with the patient. Patient understand the risks, benefits and expectations and wishes to proceed with surgery.  PCP: No primary provider on file.   Discharged Condition: good  Hospital Course:  Patient underwent the above stated procedure on 06/24/2011. Patient tolerated the procedure well and brought to the recovery room in good condition and subsequently to the floor.  POD #1 BP: 161/80 ; Pulse: 40 ; Temp: 97.6 F (36.4 C) ; Resp: 18  Pt's foley was removed, as well as the hemovac drain removed. IV was changed to a saline lock. Patient reports pain as mild. Feels good this morning. No events. Ready to go home with home health. Neurovascular intact, dorsiflexion/plantar flexion intact, incision: dressing C/D/I, no cellulitis present and compartment soft.  LABS  Basename  06/25/11 0425   HGB  11.4*  HCT  35.1*    Discharge Exam: Extremities: Homans sign is negative, no sign of DVT, no edema, redness or tenderness in the calves or thighs and no ulcers, gangrene or  trophic changes  Disposition: Home with HHPT with follow up in 2 weeks  Follow-up Information    Follow up with OLIN,Shaleena Crusoe D in 2 weeks.   Contact information:   [facility] [address] [place] [address] [phone]          Discharge Orders    Future Orders Please Complete By Expires   Call MD / Call 911      Comments:   If you experience chest pain or shortness of breath, CALL 911 and be transported to the hospital emergency room.  If you develope a fever above 101 F, pus (white drainage) or increased drainage or redness at the wound, or calf pain, call your surgeon's office.   Discharge instructions      Comments:   Maintain surgical dressing for 8 days, then replace with gauze and tape. Keep the area dry and clean until follow up. Follow up in 2 weeks at [facility]. Call with any questions or concerns.     Constipation Prevention      Comments:   Drink plenty of fluids.  Prune juice may be helpful.  You may use a stool softener, such as Colace (over the counter) 100 mg twice a day.  Use MiraLax (over the counter) for constipation as needed.   Increase activity slowly as tolerated      Weight Bearing as taught in Physical Therapy      Comments:   Use a walker or crutches as instructed.   Driving restrictions      Comments:   No driving for 4 weeks   TED hose  Comments:   Use stockings (TED hose) for 2 weeks on both leg(s).  You may remove them at night for sleeping.   Change dressing      Comments:   Maintain surgical dressing for 8 days, then change the dressing daily with sterile 4 x 4 inch gauze dressing and tape. Keep the area dry and clean.      Current Discharge Medication List    START taking these medications   Details  aspirin EC 325 MG tablet Take 1 tablet (325 mg total) by mouth 2 (two) times daily. X 4 weeks Qty: 60 tablet, Refills: 0    diphenhydrAMINE (BENADRYL) 25 mg capsule Take  1 capsule (25 mg total) by mouth every 6 (six) hours as needed for itching, allergies or sleep.    docusate sodium 100 MG CAPS Take 100 mg by mouth 2 (two) times daily.    ferrous sulfate 325 (65 FE) MG tablet Take 1 tablet (325 mg total) by mouth 3 (three) times daily after meals.    HYDROcodone-acetaminophen (NORCO) 7.5-325 MG per tablet Take 1-2 tablets by mouth every 4 (four) hours. Qty: 120 tablet, Refills: 0    methocarbamol (ROBAXIN) 500 MG tablet Take 1 tablet (500 mg total) by mouth every 6 (six) hours as needed (muscle spasms).    polyethylene glycol (MIRALAX / GLYCOLAX) packet Take 17 g by mouth 2 (two) times daily.      CONTINUE these medications which have NOT CHANGED   Details  Biotin 1000 MCG tablet Take 1,000 mcg by mouth daily.    BROMOCRIPTINE MESYLATE PO Take 1 tablet by mouth daily. AT NIGHT    Cyanocobalamin (VITAMIN B-12) 1000 MCG/15ML LIQD Take 0.5 mLs by mouth daily.    FOLIC ACID PO Take 1 tablet by mouth daily.    Multiple Vitamin (MULITIVITAMIN WITH MINERALS) TABS Take 1 tablet by mouth daily. One-a-Day Mind Active.      STOP taking these medications     naproxen sodium (ANAPROX) 220 MG tablet Comments:  Reason for Stopping:          Signed: Anastasio Auerbach. Lakeith Careaga   PAC  06/25/2011, 7:55 AM

## 2011-07-15 ENCOUNTER — Encounter (HOSPITAL_COMMUNITY): Payer: Self-pay | Admitting: Orthopedic Surgery

## 2011-07-19 ENCOUNTER — Other Ambulatory Visit: Payer: PRIVATE HEALTH INSURANCE | Admitting: *Deleted

## 2011-08-16 ENCOUNTER — Encounter: Payer: Self-pay | Admitting: Cardiovascular Disease

## 2016-07-02 ENCOUNTER — Other Ambulatory Visit: Payer: Self-pay | Admitting: Neurosurgery

## 2016-09-17 ENCOUNTER — Encounter (HOSPITAL_COMMUNITY): Payer: Self-pay

## 2016-09-17 ENCOUNTER — Encounter (HOSPITAL_COMMUNITY)
Admission: RE | Admit: 2016-09-17 | Discharge: 2016-09-17 | Disposition: A | Discharge status: Home / Self Care | Payer: BLUE CROSS/BLUE SHIELD | Source: Ambulatory Visit | Attending: Neurosurgery | Admitting: Neurosurgery

## 2016-09-17 DIAGNOSIS — E119 Type 2 diabetes mellitus without complications: Secondary | ICD-10-CM | POA: Diagnosis not present

## 2016-09-17 DIAGNOSIS — I10 Essential (primary) hypertension: Secondary | ICD-10-CM | POA: Diagnosis not present

## 2016-09-17 DIAGNOSIS — Z01812 Encounter for preprocedural laboratory examination: Secondary | ICD-10-CM | POA: Insufficient documentation

## 2016-09-17 DIAGNOSIS — Z01810 Encounter for preprocedural cardiovascular examination: Secondary | ICD-10-CM | POA: Diagnosis present

## 2016-09-17 DIAGNOSIS — Z87891 Personal history of nicotine dependence: Secondary | ICD-10-CM | POA: Insufficient documentation

## 2016-09-17 HISTORY — DX: Headache, unspecified: R51.9

## 2016-09-17 HISTORY — DX: Type 2 diabetes mellitus without complications: E11.9

## 2016-09-17 HISTORY — DX: Headache: R51

## 2016-09-17 LAB — BASIC METABOLIC PANEL
ANION GAP: 8 (ref 5–15)
BUN: 8 mg/dL (ref 6–20)
CHLORIDE: 101 mmol/L (ref 101–111)
CO2: 29 mmol/L (ref 22–32)
Calcium: 9.2 mg/dL (ref 8.9–10.3)
Creatinine, Ser: 0.91 mg/dL (ref 0.44–1.00)
GFR calc Af Amer: >60 mL/min (ref >60)
GLUCOSE: 156 mg/dL — AB (ref 65–99)
POTASSIUM: 3.9 mmol/L (ref 3.5–5.1)
Sodium: 138 mmol/L (ref 135–145)

## 2016-09-17 LAB — ABO/RH: ABO/RH(D): A POS

## 2016-09-17 LAB — CBC
HEMATOCRIT: 41 % (ref 36.0–46.0)
HEMOGLOBIN: 13 g/dL (ref 12.0–15.0)
MCH: 27.3 pg (ref 26.0–34.0)
MCHC: 31.7 g/dL (ref 30.0–36.0)
MCV: 86.1 fL (ref 78.0–100.0)
Platelets: 274 10*3/uL (ref 150–400)
RBC: 4.76 MIL/uL (ref 3.87–5.11)
RDW: 12.9 % (ref 11.5–15.5)
WBC: 4.7 10*3/uL (ref 4.0–10.5)

## 2016-09-17 LAB — SURGICAL PCR SCREEN
MRSA, PCR: POSITIVE — AB
STAPHYLOCOCCUS AUREUS: POSITIVE — AB

## 2016-09-17 LAB — GLUCOSE, CAPILLARY: GLUCOSE-CAPILLARY: 162 mg/dL — AB (ref 65–99)

## 2016-09-17 LAB — HCG, SERUM, QUALITATIVE: Preg, Serum: NEGATIVE

## 2016-09-17 NOTE — Pre-Procedure Instructions (Addendum)
[name]  [birth year]      [facility] [address], [place] [address] Phone: [phone] Fax: [phone]  [id number] [name], [place] [address] Phone: [phone] Fax: [phone]    Your procedure is scheduled on Wednesday May 16.  Report to [facility] Admitting at 6:30 A.M.  Call this number if you have problems the morning of surgery:  [phone]   Remember:  Do not eat food or drink liquids after midnight.  Take these medicines the morning of surgery with A SIP OF WATER:   7 days prior to surgery 09/18/16 STOP taking any Aspirin, Aleve, Naproxen, Ibuprofen, Motrin, Advil, Goody's, BC's, all herbal medications, fish oil, and all vitamins  DO NOT TAKE metformin (glucophage) the day of surgery.    How to Manage Your Diabetes Before and After Surgery  Why is it important to control my blood sugar before and after surgery? . Improving blood sugar levels before and after surgery helps healing and can limit problems. . A way of improving blood sugar control is eating a healthy diet by: o  Eating less sugar and carbohydrates o  Increasing activity/exercise o  Talking with your doctor about reaching your blood sugar goals . High blood sugars (greater than 180 mg/dL) can raise your risk of infections and slow your recovery, so you will need to focus on controlling your diabetes during the weeks before surgery. . Make sure that the doctor who takes care of your diabetes knows about your planned surgery including the date and location.  How do I manage my blood sugar before surgery? . Check your blood sugar at least 4 times a day, starting 2 days before surgery, to make sure that the level is not too high or low. o Check your blood sugar the morning of your surgery when you wake up and every 2 hours until you get to  the Short Stay unit. . If your blood sugar is less than 70 mg/dL, you will need to treat for low blood sugar: o Do not take insulin. o Treat a low blood sugar (less than 70 mg/dL) with  cup of clear juice (cranberry or apple), 4 glucose tablets, OR glucose gel. o Recheck blood sugar in 15 minutes after treatment (to make sure it is greater than 70 mg/dL). If your blood sugar is not greater than 70 mg/dL on recheck, call [phone] for further instructions. . Report your blood sugar to the short stay nurse when you get to Short Stay.  . If you are admitted to the hospital after surgery: o Your blood sugar will be checked by the staff and you will probably be given insulin after surgery (instead of oral diabetes medicines) to make sure you have good blood sugar levels. o The goal for blood sugar control after surgery is 80-180  o  o Do not wear jewelry, make-up or nail polish.  Do not wear lotions, powders, or perfumes, or deoderant.  Do not shave 48 hours prior to surgery.  Men may shave face and neck.  Do not bring valuables to the hospital.  [facility] is not responsible for any belongings or valuables.  Contacts, dentures or bridgework may not be worn into surgery.  Leave your suitcase in the car.  After surgery it may be brought to your room.  For patients  admitted to the hospital, discharge time will be determined by your treatment team.  Patients discharged the day of surgery will not be allowed to drive home.    Special instructions:    [facility]- Preparing For Surgery  Before surgery, you can play an important role. Because skin is not sterile, your skin needs to be as free of germs as possible. You can reduce the number of germs on your skin by washing with CHG (chlorahexidine gluconate) Soap before surgery.  CHG is an antiseptic cleaner which kills germs and bonds with the skin to continue killing germs even after washing.  Please do not use if you have an allergy to CHG  or antibacterial soaps. If your skin becomes reddened/irritated stop using the CHG.  Do not shave (including legs and underarms) for at least 48 hours prior to first CHG shower. It is OK to shave your face.  Please follow these instructions carefully.   1. Shower the NIGHT BEFORE SURGERY and the MORNING OF SURGERY with CHG.   2. If you chose to wash your hair, wash your hair first as usual with your normal shampoo.  3. After you shampoo, rinse your hair and body thoroughly to remove the shampoo.  4. Use CHG as you would any other liquid soap. You can apply CHG directly to the skin and wash gently with a scrungie or a clean washcloth.   5. Apply the CHG Soap to your body ONLY FROM THE NECK DOWN.  Do not use on open wounds or open sores. Avoid contact with your eyes, ears, mouth and genitals (private parts). Wash genitals (private parts) with your normal soap.  6. Wash thoroughly, paying special attention to the area where your surgery will be performed.  7. Thoroughly rinse your body with warm water from the neck down.  8. DO NOT shower/wash with your normal soap after using and rinsing off the CHG Soap.  9. Pat yourself dry with a CLEAN TOWEL.   10. Wear CLEAN PAJAMAS   11. Place CLEAN SHEETS on your bed the night of your first shower and DO NOT SLEEP WITH PETS.    Day of Surgery: Do not apply any deodorants/lotions. Please wear clean clothes to the hospital/surgery center.

## 2016-09-18 LAB — HEMOGLOBIN A1C
HEMOGLOBIN A1C: 9.7 % — AB (ref 4.8–5.6)
MEAN PLASMA GLUCOSE: 232 mg/dL

## 2016-09-18 NOTE — Progress Notes (Addendum)
Anesthesia Chart Review:  Pt is a 51 year old female scheduled for right pterional craniotomy for tumor on 09/25/2016 with Ashok Pall, M.D.  [facility] includes: HTN, PVCs, DM. Former smoker. BMI 31. S/p R unicompartmental knee 06/24/11  Medications include: Benazepril, cabergoline, metformin, pravastatin.  Preoperative labs reviewed. HbA1c 9.7, glucose 156.  EKG 09/17/16: NSR.  Pt reports seeing someone at [facility] in [place] and having some cardiac testing. Attempting to get records.   Willeen Cass, FNP-BC [facility] Short Stay Surgical Center/Anesthesiology Phone: [phone] 09/18/2016 12:30 PM  Addendum:   Echo 09/20/14 (done for heart murmur at [facility]):  1. LA is dilated. 2. LV is mildly dilated. Mild LVH. Normal LV contraction. EF 60%. 3. No OSA M seen. 4. Mild MR. 5. TAPSE: 23.5  No other records found at [facility] [employer] [facility].   If no changes, I anticipate pt can proceed with surgery as scheduled.   Willeen Cass, FNP-BC [facility] Short Stay Surgical Center/Anesthesiology Phone: [phone] 09/19/2016 4:54 PM

## 2016-09-24 NOTE — Anesthesia Preprocedure Evaluation (Addendum)
Anesthesia Evaluation  Patient identified by MRN, date of birth, ID band Patient awake    Reviewed: Allergy & Precautions, NPO status , Patient's Chart, lab work & pertinent test results  Airway Mallampati: II  TM Distance: >3 FB Neck ROM: Full    Dental no notable dental hx. (+) Dental Advisory Given   Pulmonary former smoker,    Pulmonary exam normal        Cardiovascular hypertension, Pt. on medications Normal cardiovascular exam     Neuro/Psych negative psych ROS   GI/Hepatic negative GI ROS, Neg liver ROS,   Endo/Other  diabetes  Renal/GU negative Renal ROS     Musculoskeletal negative musculoskeletal ROS (+)   Abdominal   Peds  Hematology negative hematology ROS (+)   Anesthesia Other Findings Day of surgery medications reviewed with the patient.  Reproductive/Obstetrics                            Anesthesia Physical Anesthesia Plan  ASA: III  Anesthesia Plan: General   Post-op Pain Management:    Induction: Intravenous  Airway Management Planned: Oral ETT  Additional Equipment: Arterial line and CVP  Intra-op Plan:   Post-operative Plan: Possible Post-op intubation/ventilation  Informed Consent: I have reviewed the patients History and Physical, chart, labs and discussed the procedure including the risks, benefits and alternatives for the proposed anesthesia with the patient or authorized representative who has indicated his/her understanding and acceptance.   Dental advisory given  Plan Discussed with: Anesthesiologist and CRNA  Anesthesia Plan Comments:       Anesthesia Quick Evaluation

## 2016-09-25 ENCOUNTER — Inpatient Hospital Stay (HOSPITAL_COMMUNITY): Payer: BLUE CROSS/BLUE SHIELD | Admitting: Anesthesiology

## 2016-09-25 ENCOUNTER — Encounter (HOSPITAL_COMMUNITY): Payer: Self-pay

## 2016-09-25 ENCOUNTER — Encounter (HOSPITAL_COMMUNITY)
Admission: RE | Disposition: A | Discharge status: Home / Self Care | Payer: Self-pay | Source: Ambulatory Visit | Attending: Neurosurgery

## 2016-09-25 ENCOUNTER — Inpatient Hospital Stay (HOSPITAL_COMMUNITY): Payer: BLUE CROSS/BLUE SHIELD | Admitting: Emergency Medicine

## 2016-09-25 ENCOUNTER — Inpatient Hospital Stay (HOSPITAL_COMMUNITY)
Admission: RE | Admit: 2016-09-25 | Discharge: 2016-09-28 | DRG: 027 | Disposition: A | Discharge status: Home / Self Care | Payer: BLUE CROSS/BLUE SHIELD | Source: Ambulatory Visit | Attending: Neurosurgery | Admitting: Neurosurgery

## 2016-09-25 DIAGNOSIS — I10 Essential (primary) hypertension: Secondary | ICD-10-CM | POA: Diagnosis present

## 2016-09-25 DIAGNOSIS — Z7984 Long term (current) use of oral hypoglycemic drugs: Secondary | ICD-10-CM

## 2016-09-25 DIAGNOSIS — Z8249 Family history of ischemic heart disease and other diseases of the circulatory system: Secondary | ICD-10-CM | POA: Diagnosis not present

## 2016-09-25 DIAGNOSIS — D320 Benign neoplasm of cerebral meninges: Principal | ICD-10-CM

## 2016-09-25 DIAGNOSIS — Z87891 Personal history of nicotine dependence: Secondary | ICD-10-CM

## 2016-09-25 DIAGNOSIS — R531 Weakness: Secondary | ICD-10-CM | POA: Diagnosis present

## 2016-09-25 DIAGNOSIS — Z833 Family history of diabetes mellitus: Secondary | ICD-10-CM | POA: Diagnosis not present

## 2016-09-25 DIAGNOSIS — E119 Type 2 diabetes mellitus without complications: Secondary | ICD-10-CM | POA: Diagnosis present

## 2016-09-25 DIAGNOSIS — D329 Benign neoplasm of meninges, unspecified: Secondary | ICD-10-CM | POA: Diagnosis present

## 2016-09-25 HISTORY — PX: CRANIOTOMY: SHX93

## 2016-09-25 LAB — CBC
HEMATOCRIT: 32.6 % — AB (ref 36.0–46.0)
HEMOGLOBIN: 10.2 g/dL — AB (ref 12.0–15.0)
MCH: 26.8 pg (ref 26.0–34.0)
MCHC: 31.3 g/dL (ref 30.0–36.0)
MCV: 85.8 fL (ref 78.0–100.0)
Platelets: 224 10*3/uL (ref 150–400)
RBC: 3.8 MIL/uL — ABNORMAL LOW (ref 3.87–5.11)
RDW: 13.2 % (ref 11.5–15.5)
WBC: 9.8 10*3/uL (ref 4.0–10.5)

## 2016-09-25 LAB — POCT I-STAT 7, (LYTES, BLD GAS, ICA,H+H)
Acid-base deficit: 1 mmol/L (ref 0.0–2.0)
Bicarbonate: 24 mmol/L (ref 20.0–28.0)
Bicarbonate: 22.3 mmol/L (ref 20.0–28.0)
CALCIUM ION: 1.07 mmol/L — AB (ref 1.15–1.40)
CALCIUM ION: 1.09 mmol/L — AB (ref 1.15–1.40)
HCT: 29 % — ABNORMAL LOW (ref 36.0–46.0)
HEMATOCRIT: 30 % — AB (ref 36.0–46.0)
HEMOGLOBIN: 10.2 g/dL — AB (ref 12.0–15.0)
Hemoglobin: 9.9 g/dL — ABNORMAL LOW (ref 12.0–15.0)
O2 SAT: 99 %
O2 SAT: 100 %
PCO2 ART: 30.8 mmHg — AB (ref 32.0–48.0)
PH ART: 7.465 — AB (ref 7.350–7.450)
PO2 ART: 205 mmHg — AB (ref 83.0–108.0)
Patient temperature: 36.5
Potassium: 3.5 mmol/L (ref 3.5–5.1)
Potassium: 3.7 mmol/L (ref 3.5–5.1)
SODIUM: 137 mmol/L (ref 135–145)
Sodium: 143 mmol/L (ref 135–145)
TCO2: 25 mmol/L (ref 0–100)
TCO2: 23 mmol/L (ref 0–100)
pCO2 arterial: 34.2 mmHg (ref 32.0–48.0)
pH, Arterial: 7.447 (ref 7.350–7.450)
pO2, Arterial: 146 mmHg — ABNORMAL HIGH (ref 83.0–108.0)

## 2016-09-25 LAB — POCT I-STAT 4, (NA,K, GLUC, HGB,HCT)
GLUCOSE: 188 mg/dL — AB (ref 65–99)
GLUCOSE: 150 mg/dL — AB (ref 65–99)
Glucose, Bld: 181 mg/dL — ABNORMAL HIGH (ref 65–99)
Glucose, Bld: 157 mg/dL — ABNORMAL HIGH (ref 65–99)
Glucose, Bld: 139 mg/dL — ABNORMAL HIGH (ref 65–99)
HCT: 28 % — ABNORMAL LOW (ref 36.0–46.0)
HEMATOCRIT: 30 % — AB (ref 36.0–46.0)
HEMATOCRIT: 29 % — AB (ref 36.0–46.0)
HEMATOCRIT: 28 % — AB (ref 36.0–46.0)
HEMATOCRIT: 27 % — AB (ref 36.0–46.0)
HEMOGLOBIN: 9.5 g/dL — AB (ref 12.0–15.0)
HEMOGLOBIN: 9.2 g/dL — AB (ref 12.0–15.0)
Hemoglobin: 10.2 g/dL — ABNORMAL LOW (ref 12.0–15.0)
Hemoglobin: 9.9 g/dL — ABNORMAL LOW (ref 12.0–15.0)
Hemoglobin: 9.5 g/dL — ABNORMAL LOW (ref 12.0–15.0)
POTASSIUM: 3.9 mmol/L (ref 3.5–5.1)
POTASSIUM: 3.5 mmol/L (ref 3.5–5.1)
POTASSIUM: 3.4 mmol/L — AB (ref 3.5–5.1)
Potassium: 3.8 mmol/L (ref 3.5–5.1)
Potassium: 3.7 mmol/L (ref 3.5–5.1)
SODIUM: 140 mmol/L (ref 135–145)
SODIUM: 143 mmol/L (ref 135–145)
Sodium: 141 mmol/L (ref 135–145)
Sodium: 142 mmol/L (ref 135–145)
Sodium: 144 mmol/L (ref 135–145)

## 2016-09-25 LAB — CREATININE, SERUM: Creatinine, Ser: 1.03 mg/dL — ABNORMAL HIGH (ref 0.44–1.00)

## 2016-09-25 LAB — GLUCOSE, CAPILLARY
GLUCOSE-CAPILLARY: 137 mg/dL — AB (ref 65–99)
GLUCOSE-CAPILLARY: 112 mg/dL — AB (ref 65–99)
GLUCOSE-CAPILLARY: 280 mg/dL — AB (ref 65–99)
Glucose-Capillary: 119 mg/dL — ABNORMAL HIGH (ref 65–99)

## 2016-09-25 LAB — PREPARE RBC (CROSSMATCH)

## 2016-09-25 SURGERY — CRANIOTOMY TUMOR EXCISION
Anesthesia: General | Site: Head | Laterality: Right

## 2016-09-25 MED ORDER — THROMBIN 20000 UNITS EX SOLR
CUTANEOUS | Status: DC | PRN
  Administered 2016-09-25: 08:00:00 via TOPICAL

## 2016-09-25 MED ORDER — CHLORHEXIDINE GLUCONATE CLOTH 2 % EX PADS: 6.0000 | MEDICATED_PAD | Freq: Once | CUTANEOUS | Status: DC

## 2016-09-25 MED ORDER — CEFAZOLIN SODIUM-DEXTROSE 2-4 GM/100ML-% IV SOLN
INTRAVENOUS | Status: AC
  Filled 2016-09-25: qty 100

## 2016-09-25 MED ORDER — MANNITOL 20 % IV SOLN
INTRAVENOUS | Status: DC | PRN
  Administered 2016-09-25: 20 g/h via INTRAVENOUS

## 2016-09-25 MED ORDER — DEXAMETHASONE SODIUM PHOSPHATE 10 MG/ML IJ SOLN
INTRAMUSCULAR | Status: AC
  Filled 2016-09-25: qty 1

## 2016-09-25 MED ORDER — INSULIN ASPART 100 UNIT/ML ~~LOC~~ SOLN
4.0000 [IU] | Freq: Three times a day (TID) | SUBCUTANEOUS | Status: DC
  Administered 2016-09-26 – 2016-09-28 (×7): 4 [IU] via SUBCUTANEOUS

## 2016-09-25 MED ORDER — INSULIN ASPART 100 UNIT/ML ~~LOC~~ SOLN
0.0000 [IU] | Freq: Three times a day (TID) | SUBCUTANEOUS | Status: DC
  Administered 2016-09-26: 11 [IU] via SUBCUTANEOUS
  Administered 2016-09-26: 4 [IU] via SUBCUTANEOUS
  Administered 2016-09-26: 15 [IU] via SUBCUTANEOUS

## 2016-09-25 MED ORDER — SODIUM CHLORIDE 0.9 % IV SOLN
0.0125 ug/kg/min | INTRAVENOUS | Status: DC
  Filled 2016-09-25: qty 2000

## 2016-09-25 MED ORDER — ONDANSETRON HCL 4 MG/2ML IJ SOLN
INTRAMUSCULAR | Status: DC | PRN
  Administered 2016-09-25: 4 mg via INTRAVENOUS

## 2016-09-25 MED ORDER — MAGNESIUM CITRATE PO SOLN: 1.0000 | Freq: Once | ORAL | Status: DC | PRN

## 2016-09-25 MED ORDER — SODIUM CHLORIDE 0.9 % IV SOLN
Freq: Once | INTRAVENOUS | Status: AC
  Administered 2016-09-25: 2.4 [IU]/h via INTRAVENOUS
  Filled 2016-09-25: qty 1

## 2016-09-25 MED ORDER — CABERGOLINE 0.5 MG PO TABS: 0.5000 mg | ORAL_TABLET | ORAL | Status: DC

## 2016-09-25 MED ORDER — PROPOFOL 10 MG/ML IV BOLUS
INTRAVENOUS | Status: AC
Start: 2016-09-25 — End: 2016-09-25
  Filled 2016-09-25: qty 40

## 2016-09-25 MED ORDER — ROCURONIUM BROMIDE 100 MG/10ML IV SOLN
INTRAVENOUS | Status: DC | PRN
  Administered 2016-09-25: 20 mg via INTRAVENOUS
  Administered 2016-09-25: 30 mg via INTRAVENOUS
  Administered 2016-09-25: 20 mg via INTRAVENOUS
  Administered 2016-09-25: 100 mg via INTRAVENOUS
  Administered 2016-09-25: 20 mg via INTRAVENOUS

## 2016-09-25 MED ORDER — MORPHINE SULFATE (PF) 2 MG/ML IV SOLN: 1.0000 mg | INTRAVENOUS | Status: DC | PRN

## 2016-09-25 MED ORDER — PROMETHAZINE HCL 12.5 MG PO TABS
12.5000 mg | ORAL_TABLET | ORAL | Status: DC | PRN
  Filled 2016-09-25: qty 2

## 2016-09-25 MED ORDER — SODIUM CHLORIDE 0.9 % IV SOLN
500.0000 mg | Freq: Once | INTRAVENOUS | Status: AC
  Administered 2016-09-25: 500 mg via INTRAVENOUS
  Filled 2016-09-25: qty 5

## 2016-09-25 MED ORDER — BACITRACIN ZINC 500 UNIT/GM EX OINT
TOPICAL_OINTMENT | CUTANEOUS | Status: AC
  Filled 2016-09-25: qty 28.35

## 2016-09-25 MED ORDER — DOCUSATE SODIUM 100 MG PO CAPS
100.0000 mg | ORAL_CAPSULE | Freq: Two times a day (BID) | ORAL | Status: DC
  Administered 2016-09-25 – 2016-09-28 (×6): 100 mg via ORAL
  Filled 2016-09-25 (×6): qty 1

## 2016-09-25 MED ORDER — SODIUM CHLORIDE 0.9 % IV SOLN
0.0500 ug/kg/min | Freq: Once | INTRAVENOUS | Status: AC
  Administered 2016-09-25: .2 ug/kg/min via INTRAVENOUS
  Filled 2016-09-25: qty 5000

## 2016-09-25 MED ORDER — SUGAMMADEX SODIUM 500 MG/5ML IV SOLN
INTRAVENOUS | Status: AC
  Filled 2016-09-25: qty 5

## 2016-09-25 MED ORDER — LABETALOL HCL 5 MG/ML IV SOLN
INTRAVENOUS | Status: DC | PRN
  Administered 2016-09-25 (×4): 10 mg via INTRAVENOUS

## 2016-09-25 MED ORDER — PANTOPRAZOLE SODIUM 40 MG IV SOLR
40.0000 mg | Freq: Every day | INTRAVENOUS | Status: DC
  Administered 2016-09-25: 40 mg via INTRAVENOUS
  Filled 2016-09-25: qty 40

## 2016-09-25 MED ORDER — SODIUM CHLORIDE 0.9 % IV SOLN
INTRAVENOUS | Status: DC | PRN
  Administered 2016-09-25 (×2): via INTRAVENOUS

## 2016-09-25 MED ORDER — LACTATED RINGERS IV SOLN
INTRAVENOUS | Status: DC | PRN
  Administered 2016-09-25: 08:00:00 via INTRAVENOUS

## 2016-09-25 MED ORDER — VITAMIN B-12 100 MCG PO TABS
100.0000 ug | ORAL_TABLET | Freq: Every day | ORAL | Status: DC
  Administered 2016-09-26 – 2016-09-28 (×3): 100 ug via ORAL
  Filled 2016-09-25 (×3): qty 1

## 2016-09-25 MED ORDER — DEXAMETHASONE SODIUM PHOSPHATE 4 MG/ML IJ SOLN
4.0000 mg | Freq: Four times a day (QID) | INTRAMUSCULAR | Status: AC
  Administered 2016-09-26 – 2016-09-27 (×4): 4 mg via INTRAVENOUS
  Filled 2016-09-25 (×4): qty 1

## 2016-09-25 MED ORDER — 0.9 % SODIUM CHLORIDE (POUR BTL) OPTIME
TOPICAL | Status: DC | PRN
  Administered 2016-09-25 (×3): 1000 mL

## 2016-09-25 MED ORDER — NALOXONE HCL 0.4 MG/ML IJ SOLN: 0.0800 mg | INTRAMUSCULAR | Status: DC | PRN

## 2016-09-25 MED ORDER — ROCURONIUM BROMIDE 10 MG/ML (PF) SYRINGE
PREFILLED_SYRINGE | INTRAVENOUS | Status: AC
  Filled 2016-09-25: qty 10

## 2016-09-25 MED ORDER — METFORMIN HCL 500 MG PO TABS
500.0000 mg | ORAL_TABLET | Freq: Three times a day (TID) | ORAL | Status: DC
  Administered 2016-09-26 (×2): 500 mg via ORAL
  Filled 2016-09-25 (×2): qty 1

## 2016-09-25 MED ORDER — LABETALOL HCL 5 MG/ML IV SOLN
INTRAVENOUS | Status: AC
  Filled 2016-09-25: qty 4

## 2016-09-25 MED ORDER — FENTANYL CITRATE (PF) 250 MCG/5ML IJ SOLN
INTRAMUSCULAR | Status: AC
  Filled 2016-09-25: qty 5

## 2016-09-25 MED ORDER — DEXAMETHASONE SODIUM PHOSPHATE 10 MG/ML IJ SOLN
INTRAMUSCULAR | Status: DC | PRN
  Administered 2016-09-25 (×2): 10 mg via INTRAVENOUS

## 2016-09-25 MED ORDER — ESMOLOL HCL 100 MG/10ML IV SOLN
INTRAVENOUS | Status: DC | PRN
  Administered 2016-09-25: 40 mg via INTRAVENOUS

## 2016-09-25 MED ORDER — HEPARIN SODIUM (PORCINE) 5000 UNIT/ML IJ SOLN
5000.0000 [IU] | Freq: Three times a day (TID) | INTRAMUSCULAR | Status: DC
  Administered 2016-09-27 – 2016-09-28 (×3): 5000 [IU] via SUBCUTANEOUS
  Filled 2016-09-25 (×3): qty 1

## 2016-09-25 MED ORDER — PRAVASTATIN SODIUM 20 MG PO TABS
20.0000 mg | ORAL_TABLET | Freq: Every day | ORAL | Status: DC
  Administered 2016-09-26 – 2016-09-28 (×3): 20 mg via ORAL
  Filled 2016-09-25 (×3): qty 1

## 2016-09-25 MED ORDER — ACETAMINOPHEN 650 MG RE SUPP: 650.0000 mg | RECTAL | Status: DC | PRN

## 2016-09-25 MED ORDER — LIDOCAINE-EPINEPHRINE 1 %-1:100000 IJ SOLN
INTRAMUSCULAR | Status: AC
  Filled 2016-09-25: qty 1

## 2016-09-25 MED ORDER — LIDOCAINE-EPINEPHRINE 1 %-1:100000 IJ SOLN
INTRAMUSCULAR | Status: DC | PRN
  Administered 2016-09-25: 18 mL

## 2016-09-25 MED ORDER — ADULT MULTIVITAMIN W/MINERALS CH
1.0000 | ORAL_TABLET | Freq: Every day | ORAL | Status: DC
  Administered 2016-09-26 – 2016-09-28 (×3): 1 via ORAL
  Filled 2016-09-25 (×3): qty 1

## 2016-09-25 MED ORDER — BACITRACIN ZINC 500 UNIT/GM EX OINT
TOPICAL_OINTMENT | CUTANEOUS | Status: DC | PRN
  Administered 2016-09-25: 1 via TOPICAL

## 2016-09-25 MED ORDER — ACETAMINOPHEN 325 MG PO TABS: 650.0000 mg | ORAL_TABLET | ORAL | Status: DC | PRN

## 2016-09-25 MED ORDER — HYDROMORPHONE HCL 1 MG/ML IJ SOLN: 0.2500 mg | INTRAMUSCULAR | Status: DC | PRN

## 2016-09-25 MED ORDER — SODIUM CHLORIDE 0.9 % IV SOLN
INTRAVENOUS | Status: DC | PRN
  Administered 2016-09-25: 25 ug/min via INTRAVENOUS

## 2016-09-25 MED ORDER — SUGAMMADEX SODIUM 500 MG/5ML IV SOLN
INTRAVENOUS | Status: DC | PRN
  Administered 2016-09-25: 400 mg via INTRAVENOUS

## 2016-09-25 MED ORDER — MIDAZOLAM HCL 5 MG/5ML IJ SOLN
INTRAMUSCULAR | Status: DC | PRN
  Administered 2016-09-25: 1 mg via INTRAVENOUS

## 2016-09-25 MED ORDER — ALBUMIN HUMAN 5 % IV SOLN
INTRAVENOUS | Status: DC | PRN
  Administered 2016-09-25 (×3): via INTRAVENOUS

## 2016-09-25 MED ORDER — PROPOFOL 10 MG/ML IV BOLUS
INTRAVENOUS | Status: AC
  Filled 2016-09-25: qty 20

## 2016-09-25 MED ORDER — MICROFIBRILLAR COLL HEMOSTAT EX PADS: MEDICATED_PAD | CUTANEOUS | Status: DC | PRN

## 2016-09-25 MED ORDER — ESMOLOL HCL 100 MG/10ML IV SOLN
INTRAVENOUS | Status: AC
  Filled 2016-09-25: qty 10

## 2016-09-25 MED ORDER — ONDANSETRON HCL 4 MG PO TABS: 4.0000 mg | ORAL_TABLET | ORAL | Status: DC | PRN

## 2016-09-25 MED ORDER — HYDROCODONE-ACETAMINOPHEN 5-325 MG PO TABS
1.0000 | ORAL_TABLET | ORAL | Status: DC | PRN
  Administered 2016-09-25 – 2016-09-28 (×6): 1 via ORAL
  Filled 2016-09-25 (×6): qty 1

## 2016-09-25 MED ORDER — SENNOSIDES-DOCUSATE SODIUM 8.6-50 MG PO TABS: 1.0000 | ORAL_TABLET | Freq: Every evening | ORAL | Status: DC | PRN

## 2016-09-25 MED ORDER — CEFAZOLIN SODIUM-DEXTROSE 2-4 GM/100ML-% IV SOLN
2.0000 g | INTRAVENOUS | Status: AC
  Administered 2016-09-25 (×2): 2 g via INTRAVENOUS
  Filled 2016-09-25: qty 100

## 2016-09-25 MED ORDER — ONDANSETRON HCL 4 MG/2ML IJ SOLN: 4.0000 mg | INTRAMUSCULAR | Status: DC | PRN

## 2016-09-25 MED ORDER — FENTANYL CITRATE (PF) 100 MCG/2ML IJ SOLN
INTRAMUSCULAR | Status: DC | PRN
  Administered 2016-09-25: 50 ug via INTRAVENOUS
  Administered 2016-09-25 (×2): 100 ug via INTRAVENOUS

## 2016-09-25 MED ORDER — POTASSIUM CHLORIDE IN NACL 20-0.9 MEQ/L-% IV SOLN
INTRAVENOUS | Status: DC
  Administered 2016-09-25 – 2016-09-26 (×3): via INTRAVENOUS
  Filled 2016-09-25 (×4): qty 1000

## 2016-09-25 MED ORDER — CHOLECALCIFEROL 10 MCG (400 UNIT) PO TABS
400.0000 [IU] | ORAL_TABLET | Freq: Every day | ORAL | Status: DC
  Administered 2016-09-26 – 2016-09-28 (×3): 400 [IU] via ORAL
  Filled 2016-09-25 (×3): qty 1

## 2016-09-25 MED ORDER — MIDAZOLAM HCL 2 MG/2ML IJ SOLN
INTRAMUSCULAR | Status: AC
  Filled 2016-09-25: qty 2

## 2016-09-25 MED ORDER — FOLIC ACID 1 MG PO TABS
1.0000 mg | ORAL_TABLET | Freq: Every day | ORAL | Status: DC
  Administered 2016-09-26 – 2016-09-28 (×3): 1 mg via ORAL
  Filled 2016-09-25 (×3): qty 1

## 2016-09-25 MED ORDER — THROMBIN 20000 UNITS EX SOLR
CUTANEOUS | Status: AC
  Filled 2016-09-25: qty 20000

## 2016-09-25 MED ORDER — PROPOFOL 10 MG/ML IV BOLUS
INTRAVENOUS | Status: DC | PRN
  Administered 2016-09-25: 150 mg via INTRAVENOUS
  Administered 2016-09-25: 50 mg via INTRAVENOUS

## 2016-09-25 MED ORDER — PROMETHAZINE HCL 25 MG/ML IJ SOLN: 6.2500 mg | INTRAMUSCULAR | Status: DC | PRN

## 2016-09-25 MED ORDER — ONDANSETRON HCL 4 MG/2ML IJ SOLN
INTRAMUSCULAR | Status: AC
  Filled 2016-09-25: qty 2

## 2016-09-25 MED ORDER — BENAZEPRIL HCL 20 MG PO TABS
10.0000 mg | ORAL_TABLET | Freq: Every day | ORAL | Status: DC
  Administered 2016-09-26 – 2016-09-28 (×4): 10 mg via ORAL
  Filled 2016-09-25 (×4): qty 1

## 2016-09-25 MED ORDER — MORPHINE SULFATE (PF) 4 MG/ML IV SOLN
1.0000 mg | INTRAVENOUS | Status: DC | PRN
  Administered 2016-09-25 (×2): 2 mg via INTRAVENOUS
  Filled 2016-09-25 (×3): qty 1

## 2016-09-25 MED ORDER — MICROFIBRILLAR COLL HEMOSTAT EX PADS
MEDICATED_PAD | CUTANEOUS | Status: DC | PRN
  Administered 2016-09-25: 1 via TOPICAL

## 2016-09-25 MED ORDER — LABETALOL HCL 5 MG/ML IV SOLN
10.0000 mg | INTRAVENOUS | Status: DC | PRN
Start: 2016-09-25 — End: 2016-09-27
  Administered 2016-09-27: 40 mg via INTRAVENOUS
  Administered 2016-09-27 (×2): 20 mg via INTRAVENOUS
  Filled 2016-09-25: qty 4
  Filled 2016-09-25: qty 8
  Filled 2016-09-25: qty 4

## 2016-09-25 MED ORDER — BISACODYL 5 MG PO TBEC: 5.0000 mg | DELAYED_RELEASE_TABLET | Freq: Every day | ORAL | Status: DC | PRN

## 2016-09-25 MED ORDER — DEXAMETHASONE SODIUM PHOSPHATE 4 MG/ML IJ SOLN
4.0000 mg | Freq: Three times a day (TID) | INTRAMUSCULAR | Status: DC
  Administered 2016-09-27 – 2016-09-28 (×2): 4 mg via INTRAVENOUS
  Filled 2016-09-25 (×3): qty 1

## 2016-09-25 MED ORDER — DEXAMETHASONE SODIUM PHOSPHATE 10 MG/ML IJ SOLN
6.0000 mg | Freq: Four times a day (QID) | INTRAMUSCULAR | Status: AC
  Administered 2016-09-25 – 2016-09-26 (×3): 6 mg via INTRAVENOUS
  Filled 2016-09-25 (×3): qty 1

## 2016-09-25 MED ORDER — SODIUM CHLORIDE 0.9 % IV SOLN
500.0000 mg | Freq: Two times a day (BID) | INTRAVENOUS | Status: DC
  Administered 2016-09-25 – 2016-09-27 (×4): 500 mg via INTRAVENOUS
  Filled 2016-09-25 (×6): qty 5

## 2016-09-25 SURGICAL SUPPLY — 86 items
APL SKNCLS STERI-STRIP NONHPOA (GAUZE/BANDAGES/DRESSINGS)
BANDAGE GAUZE 4  KLING STR (GAUZE/BANDAGES/DRESSINGS) ×2 IMPLANT
BENZOIN TINCTURE PRP APPL 2/3 (GAUZE/BANDAGES/DRESSINGS) IMPLANT
BLADE CLIPPER SURG (BLADE) ×3 IMPLANT
BLADE EYE SICKLE 84 5 BEAV (BLADE) ×1 IMPLANT
BLADE EYE SICKLE 84 5MM BEAV (BLADE) ×1
BLADE SURG 15 STRL LF DISP TIS (BLADE) IMPLANT
BLADE SURG 15 STRL SS (BLADE)
BLADE ULTRA TIP 2M (BLADE) ×3 IMPLANT
BNDG GAUZE ELAST 4 BULKY (GAUZE/BANDAGES/DRESSINGS) ×4 IMPLANT
BUR ACORN 6.0 PRECISION (BURR) ×2 IMPLANT
BUR ACORN 6.0MM PRECISION (BURR) ×1
BUR MATCHSTICK NEURO 3.0 LAGG (BURR) ×2 IMPLANT
BUR SPIRAL ROUTER 2.3 (BUR) ×1 IMPLANT
BUR SPIRAL ROUTER 2.3MM (BUR) ×1
CANISTER SUCT 3000ML PPV (MISCELLANEOUS) ×3 IMPLANT
CARTRIDGE OIL MAESTRO DRILL (MISCELLANEOUS) ×1 IMPLANT
CATH VENTRIC 35X38 W/TROCAR LG (CATHETERS) IMPLANT
CLIP TI MEDIUM 6 (CLIP) IMPLANT
CONT SPEC 4OZ CLIKSEAL STRL BL (MISCELLANEOUS) ×5 IMPLANT
DIFFUSER DRILL AIR PNEUMATIC (MISCELLANEOUS) ×3 IMPLANT
DRAIN SUBARACHNOID (WOUND CARE) IMPLANT
DRAPE MICROSCOPE LEICA (MISCELLANEOUS) ×2 IMPLANT
DRAPE NEUROLOGICAL W/INCISE (DRAPES) ×2 IMPLANT
DRAPE WARM FLUID 44X44 (DRAPE) ×3 IMPLANT
DURAPREP 26ML APPLICATOR (WOUND CARE) ×2 IMPLANT
ELECT REM PT RETURN 9FT ADLT (ELECTROSURGICAL) ×3
ELECTRODE REM PT RTRN 9FT ADLT (ELECTROSURGICAL) ×1 IMPLANT
EVACUATOR 1/8 PVC DRAIN (DRAIN) IMPLANT
EVACUATOR SILICONE 100CC (DRAIN) IMPLANT
FORCEPS BIPOLAR SPETZLER 8 1.0 (NEUROSURGERY SUPPLIES) ×2 IMPLANT
GAUZE SPONGE 4X4 12PLY STRL (GAUZE/BANDAGES/DRESSINGS) ×1 IMPLANT
GAUZE SPONGE 4X4 12PLY STRL LF (GAUZE/BANDAGES/DRESSINGS) ×2 IMPLANT
GAUZE SPONGE 4X4 16PLY XRAY LF (GAUZE/BANDAGES/DRESSINGS) IMPLANT
GLOVE BIOGEL PI IND STRL 7.5 (GLOVE) IMPLANT
GLOVE BIOGEL PI INDICATOR 7.5 (GLOVE) ×2
GLOVE ECLIPSE 6.5 STRL STRAW (GLOVE) ×5 IMPLANT
GLOVE INDICATOR 6.5 STRL GRN (GLOVE) ×2 IMPLANT
GLOVE INDICATOR 7.5 STRL GRN (GLOVE) ×4 IMPLANT
GLOVE SS BIOGEL STRL SZ 7 (GLOVE) IMPLANT
GLOVE SUPERSENSE BIOGEL SZ 7 (GLOVE) ×2
GLOVE SURG SS PI 6.5 STRL IVOR (GLOVE) ×6 IMPLANT
GOWN STRL REUS W/ TWL LRG LVL3 (GOWN DISPOSABLE) ×2 IMPLANT
GOWN STRL REUS W/ TWL XL LVL3 (GOWN DISPOSABLE) IMPLANT
GOWN STRL REUS W/TWL 2XL LVL3 (GOWN DISPOSABLE) IMPLANT
GOWN STRL REUS W/TWL LRG LVL3 (GOWN DISPOSABLE) ×9
GOWN STRL REUS W/TWL XL LVL3 (GOWN DISPOSABLE) ×6
HEMOSTAT SURGICEL 2X14 (HEMOSTASIS) ×2 IMPLANT
KIT BASIN OR (CUSTOM PROCEDURE TRAY) ×3 IMPLANT
KIT DRAIN CSF ACCUDRAIN (MISCELLANEOUS) IMPLANT
KIT ROOM TURNOVER OR (KITS) ×3 IMPLANT
KNIFE ARACHNOID DISP AM-23-SB (BLADE) ×2 IMPLANT
NDL HYPO 25X1 1.5 SAFETY (NEEDLE) ×1 IMPLANT
NDL SPNL 18GX3.5 QUINCKE PK (NEEDLE) IMPLANT
NEEDLE HYPO 25X1 1.5 SAFETY (NEEDLE) ×3 IMPLANT
NEEDLE SPNL 18GX3.5 QUINCKE PK (NEEDLE) IMPLANT
NS IRRIG 1000ML POUR BTL (IV SOLUTION) ×7 IMPLANT
OIL CARTRIDGE MAESTRO DRILL (MISCELLANEOUS) ×3
PACK CRANIOTOMY (CUSTOM PROCEDURE TRAY) ×3 IMPLANT
PATTIES SURGICAL .25X.25 (GAUZE/BANDAGES/DRESSINGS) IMPLANT
PATTIES SURGICAL .5 X.5 (GAUZE/BANDAGES/DRESSINGS) ×2 IMPLANT
PATTIES SURGICAL .5 X1 (DISPOSABLE) ×2 IMPLANT
PATTIES SURGICAL .5 X3 (DISPOSABLE) ×2 IMPLANT
PATTIES SURGICAL 1/4 X 3 (GAUZE/BANDAGES/DRESSINGS) IMPLANT
PATTIES SURGICAL 1X1 (DISPOSABLE) IMPLANT
PLATE 1.5  2HOLE LNG NEURO (Plate) ×8 IMPLANT
PLATE 1.5 2HOLE LNG NEURO (Plate) IMPLANT
RUBBERBAND STERILE (MISCELLANEOUS) ×4 IMPLANT
SCREW SELF DRILL HT 1.5/4MM (Screw) ×16 IMPLANT
SLEEVE SURGICAL STRL (SLEEVE) ×1 IMPLANT
SPONGE NEURO XRAY DETECT 1X3 (DISPOSABLE) IMPLANT
SPONGE SURGIFOAM ABS GEL 100 (HEMOSTASIS) ×3 IMPLANT
STAPLER VISISTAT 35W (STAPLE) ×3 IMPLANT
SUT ETHILON 3 0 FSL (SUTURE) IMPLANT
SUT ETHILON 3 0 PS 1 (SUTURE) IMPLANT
SUT NURALON 4 0 TR CR/8 (SUTURE) ×7 IMPLANT
SUT SILK 0 TIES 10X30 (SUTURE) IMPLANT
SUT VIC AB 2-0 CT2 18 VCP726D (SUTURE) ×8 IMPLANT
TAPE CLOTH 1X10 TAN NS (GAUZE/BANDAGES/DRESSINGS) ×2 IMPLANT
TOWEL GREEN STERILE (TOWEL DISPOSABLE) ×3 IMPLANT
TOWEL GREEN STERILE FF (TOWEL DISPOSABLE) ×2 IMPLANT
TRAY FOLEY W/METER SILVER 16FR (SET/KITS/TRAYS/PACK) ×3 IMPLANT
TUBE CONNECTING 12'X1/4 (SUCTIONS) ×2
TUBE CONNECTING 12X1/4 (SUCTIONS) ×3 IMPLANT
UNDERPAD 30X30 (UNDERPADS AND DIAPERS) ×1 IMPLANT
WATER STERILE IRR 1000ML POUR (IV SOLUTION) ×3 IMPLANT

## 2016-09-25 NOTE — H&P (Signed)
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 The last time, I saw her was in February 2012. I looked at a repeat scan and she had no change in the size of the mass. It was on the right side. It was possibly touching the right optic nerve and optic chiasm. She has full extraocular movements, full visual fields. She did have decreased sensation in V1, V2, and V3 on the right compared to the left. Rest of her exam was unchanged and normal. She did get an opinion from Dr. Ernie Avena who felt that as long as this was not causing her any problems that there was no real reason to do anything to it. Taking bromocriptine at this time. There was 3.6 x 2.5 cm in diameter of the mass and that was in July of 2011. She has had no problems with her left eye. She state that she does get some blurry vision in the right eye at times and this has been going on for the last six to eight months along with some headaches, which she has had. She is 51 years of age, right handed, works for [employer]. REVIEW OF SYSTEMS: Positive for hypertension, diabetes, blurred vision only in the right eye, especially at night. No problems listed in her pain chart. PAST MEDICAL HISTORY:   Current Medical Conditions: Significant for hypertension and diabetes. She has some weakness in her vision.  Prior Operations: She says she has undergone a partial knee replacement.  Medications and Allergies: No known drug allergies. Mediations are Pravastatin, metformin, and benazepril. FAMILY HISTORY: Mother is 48 in fair health. Father 26 in good health. Hypertension and diabetes present in the family history.   SOCIAL HISTORY: She does not smoke. She does drink alcohol. She does not have a history of substance abuse. PHYSICAL EXAMINATION:  [name] on exam has full visual fields on confrontation. 5/5 strength in the upper and lower extremities. She has no drift on examination. Muscle tone, bulk, and coordination are normal. Romberg is negative. Gait is normal. 5/5 strength in the upper and lower extremities. Hearing intact to voice bilaterally and symmetric facial movements besides some slightly decrease in V1, V2, V3 on the right compared to the left. At this point in time, it is simply time to do the operation. 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 I think this would need a combined approach. 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 But I will contact [name]. I told her we should probably get this done in the first portion of 2018. She understands and wishes to proceed.

## 2016-09-25 NOTE — Op Note (Signed)
09/25/2016  5:15 PM  PATIENT:  [name]  51 y.o. 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 She has elected to undergo operative decompression of the mass.   PRE-OPERATIVE DIAGNOSIS:   Right parasellar MENINGIOMA  POST-OPERATIVE DIAGNOSIS: right parasellar MENINGIOMA  PROCEDURE:  Procedure(s):Right PTERIONAL CRANIOTOMY  FOR TUMOR resection  SURGEON: Surgeon(s): Kimberly Pall, MD Kimberly Gamma, MD  ASSISTANTS:[name], [name]  ANESTHESIA:   general  EBL:  Total I/O In: 4268 [I.V.:2400; IV Piggyback:937] Out: 2700 [Urine:2300; Blood:400]  BLOOD ADMINISTERED:none  CELL SAVER GIVEN:none  COUNT:per nursing  DRAINS: none   SPECIMEN:  Source of Specimen:  right middle fossa  DICTATION: [name] was taken to the operating room, intubated, and placed under a general anesthetic without difficulty. Once adequate anesthesia was obtained I placed her head in a three pin Mayfield head holder. I attached the Mayfield to the operating table via an adapter.  She was positioned with her head turned slightly to the left, extended, and transposed anteriorly. Her head was shaved,  Prepped,  and draped in a sterile manner. I infiltrated the planned curvilinear incision which was behind the hairline with lidocaine 1/2 percent. I opened the skin with a 10 blade and remained outside the temporalis fascia. I scored the fascia and the periosteum, then I divided the temporalis muscle with monopolar cautery, leaving a cuff of muscle on the frontal bone. I refected the scalp and muscle rostrally using fish hooks to retract them. I created a burr hole with the acorn bit, then turned the flap using the craniotome exposing the anterior and middle fossae. I then drilled the sphenoid wing down to the superior orbital fissure, creating a level surface between the two. I opened the dura and retracted it rostrally exposing the  brain. I used the suction and the bipolars to identify the optic nerve. This brought a portion of the tumor into view through the microscope. I then opened the caritico optic cisterns, and the optic sellar cisterns. The third nerve was displaced by the tumor superiorly and laterally. The tumor was a solid block anterolaterally in the middle fossa.  With Dr. Donnella Burton assistance we used the bipolar cautery, microscopic dissection, and the ultrasonic aspirator to remove tumor in the middle fossa. The tumor was quite gritty, and fibrous. Thus it took some time to remove tumor. Most of the tumor resected was in the middle and posterior fossae. I did not venture too close to the cavernous sinus for fear of damaging cranial nerves, nor the sellar region for fear of damaging the pituitary stalk. We did debulk the tumor below the optic nerve, and lateral to the third nerve. This was not a gross total resection. I cauterized heavily the bone and dural surfaces where tumor had been. When I was satisfied with the resection knowing that tumor was left behind I started my closure. I approximated the dura, then the skull flap with plates and screws. I placed tack up sutures before placing the bone flap. I achieved good hemostasis prior to closure. I approximated the galea with vicryl sutures and the scalp edges with staples. I removed the Mayfield from the adapter, then removed the head holder. I applied a sterile dressing. I sent specimen during the case. [name] was extubated and brought to the PACU  PLAN OF CARE: Admit to inpatient   PATIENT DISPOSITION:  PACU - hemodynamically stable.   Delay start of Pharmacological VTE agent (>24hrs) due  to surgical blood loss or risk of bleeding:  yes

## 2016-09-25 NOTE — Anesthesia Procedure Notes (Cosign Needed)
Procedure Name: Intubation Date/Time: 09/25/2016 8:56 AM Performed by: Duane Boston Pre-anesthesia Checklist: Patient identified, Suction available, Patient being monitored, Timeout performed and Emergency Drugs available Patient Re-evaluated:Patient Re-evaluated prior to inductionOxygen Delivery Method: Circle system utilized Preoxygenation: Pre-oxygenation with 100% oxygen Intubation Type: IV induction Ventilation: Mask ventilation without difficulty Grade View: Grade I Tube type: Oral Tube size: 7.5 mm Number of attempts: 1 Airway Equipment and Method: Stylet Placement Confirmation: ETT inserted through vocal cords under direct vision,  positive ETCO2 and breath sounds checked- equal and bilateral Secured at: 23 cm Tube secured with: Tape Dental Injury: Teeth and Oropharynx as per pre-operative assessment

## 2016-09-25 NOTE — Transfer of Care (Signed)
Immediate Anesthesia Transfer of Care Note  Patient: [name]  Procedure(s) Performed: Procedure(s): PTERIONAL CRANIOTOMY  FOR TUMOR (Right)  Patient Location: PACU  Anesthesia Type:General  Level of Consciousness: sedated, patient cooperative and responds to stimulation  Airway & Oxygen Therapy: Patient Spontanous Breathing and Patient connected to nasal cannula oxygen  Post-op Assessment: Report given to RN, Post -op Vital signs reviewed and stable and Patient moving all extremities  Post vital signs: Reviewed and stable  Last Vitals:  Vitals:   09/25/16 0640  BP: (!) 149/103  Pulse: 69  Resp: 20  Temp: 36.7 C    Last Pain:  Vitals:   09/25/16 0640  TempSrc: Oral      Patients Stated Pain Goal: 5 (34/28/76 8115)  Complications: No apparent anesthesia complications

## 2016-09-25 NOTE — Progress Notes (Signed)
Dr. Cyndy Freeze notified os patients status. Patient not responding to command. Pupils are equal and reactive. No further orders received.

## 2016-09-25 NOTE — Anesthesia Procedure Notes (Signed)
Central Venous Catheter Insertion Performed by: Duane Boston, anesthesiologist Start/End5/16/2018 8:12 AM, 09/25/2016 8:22 AM Patient location: Pre-op. Preanesthetic checklist: patient identified, IV checked, site marked, risks and benefits discussed, surgical consent, monitors and equipment checked, pre-op evaluation, timeout performed and anesthesia consent Position: Trendelenburg Lidocaine 1% used for infiltration and patient sedated Hand hygiene performed , maximum sterile barriers used  and Seldinger technique used Catheter size: 8 Fr Total catheter length 16. Central line was placed.Double lumen Procedure performed using ultrasound guided technique. Ultrasound Notes:image(s) printed for medical record Attempts: 1 Following insertion, dressing applied, line sutured and Biopatch. Post procedure assessment: blood return through all ports, free fluid flow and no air  Patient tolerated the procedure well with no immediate complications. Additional procedure comments: Attempted right SCV but unable to cannulate vein after 2 attempts.  Moved to [place].Marland Kitchen

## 2016-09-26 ENCOUNTER — Inpatient Hospital Stay (HOSPITAL_COMMUNITY): Payer: BLUE CROSS/BLUE SHIELD

## 2016-09-26 ENCOUNTER — Encounter (HOSPITAL_COMMUNITY): Payer: Self-pay | Admitting: Neurosurgery

## 2016-09-26 LAB — GLUCOSE, CAPILLARY
GLUCOSE-CAPILLARY: 311 mg/dL — AB (ref 65–99)
GLUCOSE-CAPILLARY: 237 mg/dL — AB (ref 65–99)
Glucose-Capillary: 176 mg/dL — ABNORMAL HIGH (ref 65–99)
Glucose-Capillary: 181 mg/dL — ABNORMAL HIGH (ref 65–99)
Glucose-Capillary: 269 mg/dL — ABNORMAL HIGH (ref 65–99)

## 2016-09-26 MED ORDER — INSULIN ASPART 100 UNIT/ML ~~LOC~~ SOLN: 0.0000 [IU] | SUBCUTANEOUS | Status: DC

## 2016-09-26 MED ORDER — GADOBENATE DIMEGLUMINE 529 MG/ML IV SOLN
20.0000 mL | Freq: Once | INTRAVENOUS | Status: AC
  Administered 2016-09-26: 20 mL via INTRAVENOUS

## 2016-09-26 MED ORDER — PANTOPRAZOLE SODIUM 40 MG PO TBEC
40.0000 mg | DELAYED_RELEASE_TABLET | Freq: Every day | ORAL | Status: DC
  Administered 2016-09-26: 40 mg via ORAL
  Filled 2016-09-26 (×2): qty 1

## 2016-09-26 MED ORDER — INSULIN GLARGINE 100 UNIT/ML ~~LOC~~ SOLN
30.0000 [IU] | Freq: Every day | SUBCUTANEOUS | Status: DC
  Administered 2016-09-26 – 2016-09-28 (×3): 30 [IU] via SUBCUTANEOUS
  Filled 2016-09-26 (×4): qty 0.3

## 2016-09-26 MED ORDER — MUPIROCIN 2 % EX OINT
1.0000 "application " | TOPICAL_OINTMENT | Freq: Two times a day (BID) | CUTANEOUS | Status: DC
  Administered 2016-09-26 – 2016-09-28 (×5): 1 via NASAL
  Filled 2016-09-26 (×2): qty 22

## 2016-09-26 MED ORDER — CHLORHEXIDINE GLUCONATE CLOTH 2 % EX PADS
6.0000 | MEDICATED_PAD | Freq: Every day | CUTANEOUS | Status: DC
  Administered 2016-09-26: 6 via TOPICAL

## 2016-09-26 MED ORDER — INSULIN ASPART 100 UNIT/ML ~~LOC~~ SOLN
0.0000 [IU] | SUBCUTANEOUS | Status: DC
  Administered 2016-09-26: 7 [IU] via SUBCUTANEOUS
  Administered 2016-09-27 (×4): 4 [IU] via SUBCUTANEOUS
  Administered 2016-09-27: 7 [IU] via SUBCUTANEOUS
  Administered 2016-09-27 (×2): 3 [IU] via SUBCUTANEOUS
  Administered 2016-09-28 (×2): 4 [IU] via SUBCUTANEOUS

## 2016-09-26 NOTE — Progress Notes (Signed)
Patient ID: [name], female   DOB: Jun 07, 1965, 52 y.o.   MRN: [id number] BP (!) 153/91   Pulse 80   Temp 97 F (36.1 C) (Axillary)   Resp 12   Ht 5\' 10"  (1.778 m)   Wt 98.9 kg (218 lb)   LMP 09/02/2016   SpO2 98%   BMI 31.28 kg/m  Alert and oriented x 4 Right periorbital edema Wound dressing is dry, clean Repeat MRI showed good resection. Obviously tumor is left but the middle fossa component was significantly reduced. Possible mild 7th on the right , but swelling precludes an absolute diagnosis.  Doing well overall.  Insulin changed

## 2016-09-26 NOTE — Progress Notes (Signed)
PHARMACIST - PHYSICIAN COMMUNICATION DR:  Christella Noa CONCERNING:  METFORMIN SAFE ADMINISTRATION POLICY  RECOMMENDATION: Metformin has been placed on DISCONTINUE (rejected order) STATUS and should be reordered only after any of the conditions below are ruled out.  Current Safety recommendations include avoiding metformin for a minimum of 48 hours after the patient's exposure to intravenous contrast media for the following conditions:  . eGFR < 60 ml/min  . Liver disease, alcoholism, heart failure, intra-arterial administration of contrast  DESCRIPTION:  The Pharmacy Committee has adopted a policy that restricts the use of metformin in hospitalized patients until all the contraindications to administration have been ruled out. Specific contraindications are: _0  Serum creatinine ? 1.5 for males _1  Serum creatinine ? 1.4 for females _2  Shock, acute MI, sepsis, hypoxemia, dehydration _3  Planned administration of intravenous iodinated contrast media when eGFR < 84m/min, Liver Disease, alcoholism, heart failure or intra-arterial administration of contrast _4  Heart Failure patients with low EF _5  Acute or chronic metabolic acidosis (including DKA)   MBrain Hilts [facility]5/17/2018 5:30 PM

## 2016-09-26 NOTE — Progress Notes (Signed)
Inpatient Diabetes Program Recommendations  AACE/ADA: New Consensus Statement on Inpatient Glycemic Control (2015)  Target Ranges:  Prepandial:   less than 140 mg/dL      Peak postprandial:   less than 180 mg/dL (1-2 hours)      Critically ill patients:  140 - 180 mg/dL   Lab Results  Component Value Date   GLUCAP 311 (H) 09/26/2016   HGBA1C 9.7 (H) 09/17/2016    Review of Glycemic Control:  Results for XCARET, MORAD (MRN [id number]) as of 09/26/2016 10:42  Ref. Range 09/25/2016 17:47 09/25/2016 22:40 09/26/2016 07:41  Glucose-Capillary Latest Ref Range: 65 - 99 mg/dL 112 (H) 280 (H) 311 (H)   Diabetes history: Type 2 diabetes Outpatient Diabetes medications: Metformin 500 mg tid with meals  Current orders for Inpatient glycemic control:  Novolog resistant tid with meals, Novolog 4 units tid with meals.  Inpatient Diabetes Program Recommendations:    While patient is on steroids, please increase frequency of Novolog correction to q 4 hours.  Also please add Lantus 30 units daily (0.3 units/kg).   Thanks, Adah Perl, RN, BC-ADM Inpatient Diabetes Coordinator Pager [phone] (8a-5p)

## 2016-09-26 NOTE — Anesthesia Postprocedure Evaluation (Signed)
Anesthesia Post Note  Patient: [name]  Procedure(s) Performed: Procedure(s) (LRB): PTERIONAL CRANIOTOMY  FOR TUMOR (Right)  Patient location during evaluation: PACU Anesthesia Type: General Level of consciousness: awake and alert Pain management: pain level controlled Vital Signs Assessment: post-procedure vital signs reviewed and stable Respiratory status: spontaneous breathing, nonlabored ventilation, respiratory function stable and patient connected to nasal cannula oxygen Cardiovascular status: blood pressure returned to baseline and stable Postop Assessment: no signs of nausea or vomiting Anesthetic complications: no        Last Vitals:  Vitals:   09/26/16 0700 09/26/16 0743  BP: (!) 156/90   Pulse: 72   Resp: 13   Temp:  36.7 C    Last Pain:  Vitals:   09/26/16 0743  TempSrc: Oral  PainSc:    Pain Goal: Patients Stated Pain Goal: 1 (09/25/16 2343)               Montez Hageman

## 2016-09-27 LAB — GLUCOSE, CAPILLARY
GLUCOSE-CAPILLARY: 182 mg/dL — AB (ref 65–99)
Glucose-Capillary: 129 mg/dL — ABNORMAL HIGH (ref 65–99)
Glucose-Capillary: 147 mg/dL — ABNORMAL HIGH (ref 65–99)
Glucose-Capillary: 151 mg/dL — ABNORMAL HIGH (ref 65–99)
Glucose-Capillary: 229 mg/dL — ABNORMAL HIGH (ref 65–99)
Glucose-Capillary: 143 mg/dL — ABNORMAL HIGH (ref 65–99)

## 2016-09-27 MED ORDER — HYDRALAZINE HCL 20 MG/ML IJ SOLN
5.0000 mg | Freq: Once | INTRAMUSCULAR | Status: AC
  Administered 2016-09-27: 5 mg via INTRAVENOUS
  Filled 2016-09-27: qty 1

## 2016-09-27 MED ORDER — LEVETIRACETAM 500 MG PO TABS
500.0000 mg | ORAL_TABLET | Freq: Two times a day (BID) | ORAL | Status: DC
  Administered 2016-09-27 – 2016-09-28 (×2): 500 mg via ORAL
  Filled 2016-09-27 (×2): qty 1

## 2016-09-27 NOTE — Progress Notes (Signed)
Patient transferred to floor earlier this afternoon,  BP has remained elevated as followed-> 164/103; 170/90; 167/89; 185/100. On call MD called-He ordered a one time dose of 5mg  hydralazine. Will continue to monitor.

## 2016-09-27 NOTE — Progress Notes (Signed)
Patient ID: [name], female   DOB: [birth year], 51 y.o.   MRN: [id number] BP (!) 152/85   Pulse 66   Temp 98 F (36.7 C) (Oral)   Resp 12   Ht 5\' 10"  (1.778 m)   Wt 98.9 kg (218 lb)   LMP 09/02/2016   SpO2 97%   BMI 31.28 kg/m  Alert and oriented x 4, speech is clear and fluent Moving all extremities well Dressing clean, dry Will transfer today [name] for discharge when up and moving

## 2016-09-27 NOTE — Progress Notes (Signed)
Patient arrived to the floor. A&O X4, Denies pain at this time. Will continue to monitor.

## 2016-09-28 LAB — GLUCOSE, CAPILLARY: Glucose-Capillary: 169 mg/dL — ABNORMAL HIGH (ref 65–99)

## 2016-09-28 MED ORDER — METHYLPREDNISOLONE 4 MG PO TBPK: ORAL_TABLET | ORAL | 0 refills | Status: DC

## 2016-09-28 MED ORDER — HYDRALAZINE HCL 20 MG/ML IJ SOLN
5.0000 mg | Freq: Four times a day (QID) | INTRAMUSCULAR | Status: DC | PRN
  Filled 2016-09-28: qty 1

## 2016-09-28 MED ORDER — LEVETIRACETAM 500 MG PO TABS: 500.0000 mg | ORAL_TABLET | Freq: Two times a day (BID) | ORAL | 0 refills | Status: DC

## 2016-09-28 MED ORDER — HYDROCODONE-ACETAMINOPHEN 5-325 MG PO TABS: 1.0000 | ORAL_TABLET | ORAL | 0 refills | Status: DC | PRN

## 2016-09-28 NOTE — Discharge Instructions (Signed)
Meningioma Meningioma is a tumor that occurs in the thin tissue that covers the brain and spinal cord (meninges). Meningiomas are usually not cancerous (benign) and do not spread to other areas. In rare cases, a meningioma may become cancerous (malignant). What are the causes? In many cases, the cause of this condition is not known. In some cases, meningioma may be caused by:  Having a genetic disorder that causes multiple soft tumors (neurofibromatosis 2).  A change in certain genes (genetic mutation).  What increases the risk? You are more likely to develop this condition if:  You have been exposed to radiation.  You are an older woman. Older women have a higher risk of meningiomas than men or children. However, men have a higher risk of malignant meningiomas.  You have injured your head in the past.  You have a history of breast cancer.  What are the signs or symptoms? Symptoms of this condition usually begin very slowly. The symptoms may depend on the size and location of the tumor. Possible symptoms include:  Headaches.  Nausea and vomiting.  Vision changes.  Hearing changes.  Loss of the sense of smell.  Fits of uncontrolled movements (seizures).  Weakness or numbness on one side of the body or in an arm or leg.  Mood or personality changes.  Problems with memory or thinking.  How is this diagnosed? This condition is diagnosed based on:  Results of brain imaging tests, such as a CT scan or MRI.  Removal and testing of a sample of the tumor (biopsy). This may be done to confirm the diagnosis and to help determine the best treatment for the condition.  How is this treated? You may not have treatment until your symptoms start to affect your daily activities. This is because meningioma grows so slowly, and your health care provider may prefer to monitor its growth before starting treatment. If you do need treatment, it may include:  Medicines to decrease brain  swelling and improve symptoms (steroids).  High-energy rays (radiation therapy) to shrink or kill the tumor.  Anti-cancer medicines (chemotherapy) to shrink or kill the tumor. Chemotherapy has many side effects because it also kills healthy cells.  Targeted therapy. This kills cancerous cells without affecting normal cells.  Surgery to remove as much of the tumor as possible.  Follow these instructions at home:  Take over-the-counter and prescription medicines only as told by your health care provider.  Keep all follow-up visits as told by your health care provider. This is important. You may need regular visits to monitor the growth of your tumor. Contact a health care provider if:  You have symptoms that come back.  You have diarrhea.  You vomit.  You have abdominal pain.  You cannot eat or drink as much as you need.  You are weaker or more tired than usual.  You are losing weight without trying. Get help right away if:  Your diarrhea, vomiting, or abdominal pain does not go away.  You have new symptoms, such as vision problems or difficulty walking.  You have a seizure.  You have bleeding that does not stop.  You have trouble breathing.  You have a fever. Summary  Meningioma is a tumor that occurs in the thin tissue that covers the brain and spinal cord (meninges).  Meningiomas are usually benign, which means they are not cancerous and do not spread to other areas.  Symptoms of this condition usually begin very slowly. The symptoms may depend on the   size and location of the tumor.  Your tumor may be monitored over time. You may not need treatment until your tumor starts to affect your daily life. This information is not intended to replace advice given to you by your health care provider. Make sure you discuss any questions you have with your health care provider. Document Released: 05/04/2013 Document Revised: 05/03/2016 Document Reviewed: 05/03/2016 Elsevier  Interactive Patient Education  2017 [employer]  

## 2016-09-28 NOTE — Progress Notes (Signed)
RN discussed discharge instructions with patient including wound care, f/u appt, medication instructions. Neuro assessment unchanged. IV removed. Patient states she will monitor her bp at home and f/u with pcp as needed. Patient ambulated around room, steady gait, patient states this is her baseline. Patient is waiting for her transportation to arrive.

## 2016-09-28 NOTE — Discharge Summary (Signed)
Physician Discharge Summary  Patient ID: [name] MRN: [id number] DOB/AGE: [birth year] 51 y.o.  Admit date: 09/25/2016 Discharge date: 09/28/2016  Admission Diagnoses:  Discharge Diagnoses:  Active Problems:   Meningioma determined by biopsy of brain [facility])   Discharged Condition: good  Hospital Course: patient admitted to the hospital where she underwent a uncomplicated craniotomy and resection of meningioma.postoperative she'oing reasonably well. She has some mode headache. She has some swelling around her right She is up ambulating without difficulty. She feels reacharge home.Consults:   Significant Diagnostic Studies:   Treatments:   Discharge Exam: Blood pressure (!) 145/88, pulse (!) 56, temperature 98.5 F (36.9 C), temperature source Oral, resp. rate 18, height 5\' 10"  (1.778 m), weight 98.9 kg (218 lb), last menstrual period 09/02/2016, SpO2 98 %. Awake and alert. Oriented and approp. Cranial nerve function with intact vision bilaterally. Extraocular movements are full. Facial movement and sensation normal bilaongue protrudes xamination of extremities reveals 5 over 5 motor strength bilaterally. No pronator drift. Wound clean and dry.  Disposition: 01-Home or Self Care  Discharge Instructions    Remove dressing    Complete by:  As directed      Allergies as of 09/28/2016      Reactions   Other    All Meat products, pt is strictly a vegetarian       Medication List    TAKE these medications   benazepril 10 MG tablet Commonly known as:  LOTENSIN Take 10 mg by mouth daily.   cabergoline 0.5 MG tablet Commonly known as:  DOSTINEX Take 0.5 mg by mouth 2 (two) times a week.   FOLIC ACID PO Take 1 tablet by mouth daily.   HYDROcodone-acetaminophen 5-325 MG tablet Commonly known as:  NORCO/VICODIN Take 1 tablet by mouth every 4 (four) hours as needed for moderate pain.   ibuprofen 200 MG tablet Commonly known as:  ADVIL,MOTRIN Take 400 mg by mouth daily as  needed for moderate pain.   levETIRAcetam 500 MG tablet Commonly known as:  KEPPRA Take 1 tablet (500 mg total) by mouth 2 (two) times daily.   metFORMIN 500 MG tablet Commonly known as:  GLUCOPHAGE Take 500 mg by mouth 3 (three) times daily with meals.   methylPREDNISolone 4 MG Tbpk tablet Commonly known as:  MEDROL DOSEPAK follow package directions   multivitamin with minerals Tabs tablet Take 1 tablet by mouth daily.   naproxen sodium 220 MG tablet Commonly known as:  ANAPROX Take 220 mg by mouth daily as needed (pain).   neomycin-bacitracin-polymyxin ointment Commonly known as:  NEOSPORIN Apply 1 application topically daily as needed for wound care. apply to eye   pravastatin 20 MG tablet Commonly known as:  PRAVACHOL Take 20 mg by mouth daily.   vitamin A & D ointment Apply 1 application topically as needed (rash).   VITAMIN B 12 PO Take 1 tablet by mouth daily.   VITAMIN D PO Take 1 capsule by mouth daily.      Follow-up Information    Ashok Pall, MD. Schedule an appointment as soon as possible for a visit in 1 week(s).   Specialty:  Neurosurgery Contact information: [address] [place] [address] [phone]           Signed: Charlie Pitter 09/28/2016, 9:34 AM

## 2016-09-28 NOTE — Progress Notes (Signed)
Patient BP up 183/96 hr   MD notified and MD jones paged MD returned call and order to give pt's BP med now. Instead of at 10 am and this given degeneration

## 2016-09-30 LAB — TYPE AND SCREEN
ABO/RH(D): A POS
Antibody Screen: NEGATIVE
UNIT DIVISION: 0
Unit division: 0

## 2016-09-30 LAB — BPAM RBC
BLOOD PRODUCT EXPIRATION DATE: 201806012359
Blood Product Expiration Date: 201806012359
ISSUE DATE / TIME: 201805151815
UNIT TYPE AND RH: 6200
Unit Type and Rh: 6200

## 2016-10-03 ENCOUNTER — Encounter (HOSPITAL_COMMUNITY): Payer: Self-pay | Admitting: Emergency Medicine

## 2016-10-03 ENCOUNTER — Emergency Department (HOSPITAL_COMMUNITY)
Admission: EM | Admit: 2016-10-03 | Discharge: 2016-10-03 | Disposition: A | Discharge status: Home / Self Care | Payer: BLUE CROSS/BLUE SHIELD | Attending: Emergency Medicine | Admitting: Emergency Medicine

## 2016-10-03 ENCOUNTER — Emergency Department (HOSPITAL_COMMUNITY): Payer: BLUE CROSS/BLUE SHIELD

## 2016-10-03 DIAGNOSIS — Z7984 Long term (current) use of oral hypoglycemic drugs: Secondary | ICD-10-CM | POA: Diagnosis not present

## 2016-10-03 DIAGNOSIS — Z87891 Personal history of nicotine dependence: Secondary | ICD-10-CM | POA: Insufficient documentation

## 2016-10-03 DIAGNOSIS — Z8659 Personal history of other mental and behavioral disorders: Secondary | ICD-10-CM

## 2016-10-03 DIAGNOSIS — R29810 Facial weakness: Secondary | ICD-10-CM | POA: Diagnosis not present

## 2016-10-03 DIAGNOSIS — R4182 Altered mental status, unspecified: Secondary | ICD-10-CM | POA: Diagnosis present

## 2016-10-03 DIAGNOSIS — E119 Type 2 diabetes mellitus without complications: Secondary | ICD-10-CM | POA: Diagnosis not present

## 2016-10-03 DIAGNOSIS — I10 Essential (primary) hypertension: Secondary | ICD-10-CM | POA: Diagnosis not present

## 2016-10-03 DIAGNOSIS — Z79899 Other long term (current) drug therapy: Secondary | ICD-10-CM | POA: Diagnosis not present

## 2016-10-03 DIAGNOSIS — Z96651 Presence of right artificial knee joint: Secondary | ICD-10-CM | POA: Diagnosis not present

## 2016-10-03 LAB — CBC
HCT: 39.9 % (ref 36.0–46.0)
HEMOGLOBIN: 12.8 g/dL (ref 12.0–15.0)
MCH: 27.7 pg (ref 26.0–34.0)
MCHC: 32.1 g/dL (ref 30.0–36.0)
MCV: 86.4 fL (ref 78.0–100.0)
Platelets: 336 10*3/uL (ref 150–400)
RBC: 4.62 MIL/uL (ref 3.87–5.11)
RDW: 13 % (ref 11.5–15.5)
WBC: 10.3 10*3/uL (ref 4.0–10.5)

## 2016-10-03 LAB — COMPREHENSIVE METABOLIC PANEL
ALBUMIN: 3.7 g/dL (ref 3.5–5.0)
ALK PHOS: 75 U/L (ref 38–126)
ALT: 22 U/L (ref 14–54)
ANION GAP: 9 (ref 5–15)
AST: 27 U/L (ref 15–41)
BUN: 12 mg/dL (ref 6–20)
CALCIUM: 9.3 mg/dL (ref 8.9–10.3)
CO2: 29 mmol/L (ref 22–32)
Chloride: 96 mmol/L — ABNORMAL LOW (ref 101–111)
Creatinine, Ser: 0.94 mg/dL (ref 0.44–1.00)
GFR calc non Af Amer: >60 mL/min (ref >60)
GLUCOSE: 147 mg/dL — AB (ref 65–99)
POTASSIUM: 4.4 mmol/L (ref 3.5–5.1)
SODIUM: 134 mmol/L — AB (ref 135–145)
TOTAL PROTEIN: 7.6 g/dL (ref 6.5–8.1)
Total Bilirubin: 1.3 mg/dL — ABNORMAL HIGH (ref 0.3–1.2)

## 2016-10-03 LAB — URINALYSIS, ROUTINE W REFLEX MICROSCOPIC
BILIRUBIN URINE: NEGATIVE
Glucose, UA: NEGATIVE mg/dL
HGB URINE DIPSTICK: NEGATIVE
KETONES UR: NEGATIVE mg/dL
LEUKOCYTES UA: NEGATIVE
NITRITE: NEGATIVE
PH: 7 (ref 5.0–8.0)
Protein, ur: NEGATIVE mg/dL
SPECIFIC GRAVITY, URINE: 1.019 (ref 1.005–1.030)

## 2016-10-03 MED ORDER — ONDANSETRON HCL 4 MG/2ML IJ SOLN
4.0000 mg | Freq: Once | INTRAMUSCULAR | Status: AC
  Administered 2016-10-03: 4 mg via INTRAVENOUS
  Filled 2016-10-03: qty 2

## 2016-10-03 MED ORDER — GADOBENATE DIMEGLUMINE 529 MG/ML IV SOLN
20.0000 mL | Freq: Once | INTRAVENOUS | Status: AC
  Administered 2016-10-03: 20 mL via INTRAVENOUS

## 2016-10-03 MED ORDER — MORPHINE SULFATE (PF) 4 MG/ML IV SOLN
4.0000 mg | Freq: Once | INTRAVENOUS | Status: AC
  Administered 2016-10-03: 4 mg via INTRAVENOUS
  Filled 2016-10-03: qty 1

## 2016-10-03 NOTE — ED Notes (Addendum)
Pt complaining of L sided flank pain radiating into lower back, described as sharp, shooting 7/10. States its been going on "for a while but I've been putting up with it." Also c/o hot and cold chills. See VS flowsheet. MD made aware, urine specimen sent.

## 2016-10-03 NOTE — ED Triage Notes (Signed)
Pt arrives as a transfer from [facility] for intermittent periods of unresponsiveness starting at 2200 yesterday. [place] concerned for bleeding - recent craniotomy. C/o surgical site pain 5/10.

## 2016-10-03 NOTE — ED Notes (Signed)
To MRI at this time on cardiac monitor

## 2016-10-03 NOTE — ED Notes (Signed)
Per Aaron Edelman in MRI, pt in to be transported now.

## 2016-10-03 NOTE — ED Notes (Signed)
Sister at bedside.

## 2016-10-03 NOTE — ED Notes (Signed)
Pt has brief periods where she will not say anything but continues to follow commands and make eye contact.

## 2016-10-03 NOTE — ED Notes (Signed)
Patient returned from MRI . Alert oriented. Pupils 3 bilaterally and reactive. Speech is clear, states her mouth is drawn to the right switch is new for her. No weakness of any ext. Fall band applied.

## 2016-10-03 NOTE — ED Provider Notes (Signed)
[facility] DEPT Provider Note   CSN: [id number] Arrival date & time: 10/03/16  0210   By signing my name below, I, Evelene Croon, attest that this documentation has been prepared under the direction and in the presence of Bain Whichard, Gwenyth Allegra, MD . Electronically Signed: Evelene Croon, Scribe. 10/03/2016. 2:25 AM.   History   Chief Complaint Chief Complaint  Patient presents with  . Post-op Problem    The history is provided by the patient and the EMS personnel. No language interpreter was used.    HPI Comments:  [name] is a 51 y.o. female s/p uncomplicated craniotomy and resection of meningioma on 09/25/2016,  who presents to the Emergency Department via EMS as a transfer from [facility] for AMS. Per EMS, pt presented to [facility] via EMS and was in and out consciousness. Pt was transferred for higher level of care due to concern for brain bleed. EMS reports 18 gauge IV in the right AC, last BP PTA was 168/99. She was 97% RA and HR in 60s. At this time pt states she doesn't feel bad. She denies HA. Pt also denies residual weakness in her extremities following surgery.   Past Medical History:  Diagnosis Date  . 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 Diabetes mellitus without complication ([place])   . Dysrhythmia    PT STATES SHE HAS AN EXTRA HEART BEAT AT TIMES--FIRST NOTICED BY HER GYN  DR. Cletis Media COUPLE OF YRS AGO--PT STATES SHE WAS SENT FOR STRESS TEST-TREADMILL.  -PT DOESN'T HAVE ANY OTHER INFORMATION.  Marland Kitchen Headache   . Hypertension    B/P ELEVATED LAST 3 DOCTOR VISITS--BUT NO PRIOR HX AND NOT ON B/P MEDS  . Meningioma ([place])    HX OF ELEVATED PROLACTIN LEVELS-FOUND TO HAVE MENINGIOMA ADJACENT TO PITITUARY GLAND--NO OTHER PROBLEMS ASSOC WITH THE TUMOR--NO SURGERY NEEDED--PT SEES DR. [name] CABELL IN [place] ONCE A YEAR    Patient Active Problem List   Diagnosis Date Noted  . Meningioma determined by biopsy of brain ([place])  09/25/2016  . S/P right unicompartmental knee replacement 06/24/2011  . Preop cardiovascular exam 06/21/2011  . PVC (premature ventricular contraction) 06/21/2011  . Right knee pain 06/21/2011    Past Surgical History:  Procedure Laterality Date  . CESAREAN SECTION  06/1990  . CRANIOTOMY Right 09/25/2016   Procedure: PTERIONAL CRANIOTOMY  FOR TUMOR;  Surgeon: Ashok Pall, MD;  Location: [facility];  Service: Neurosurgery;  Laterality: Right;  . CYST REMOVED BOTH HANDS  1997 OR 1998  . PARTIAL KNEE ARTHROPLASTY  06/24/2011   Procedure: UNICOMPARTMENTAL KNEE;  Surgeon: Mauri Pole, MD;  Location: [facility];  Service: Orthopedics;  Laterality: Right;  . RIGHT KNEE ARTHROSCOPY  1984    OB History    No data available       Home Medications    Prior to Admission medications   Medication Sig Start Date End Date Taking? Authorizing Provider  benazepril (LOTENSIN) 10 MG tablet Take 10 mg by mouth daily.   Yes [provider]  cabergoline (DOSTINEX) 0.5 MG tablet Take 0.5 mg by mouth 2 (two) times a week.   Yes [provider]  Cholecalciferol (VITAMIN D PO) Take 1 capsule by mouth daily.   Yes [provider]  Cyanocobalamin (VITAMIN B 12 PO) Take 1 tablet by mouth daily.   Yes [provider]  FOLIC ACID PO Take 1 tablet by mouth daily.   Yes [provider]  HYDROcodone-acetaminophen (NORCO/VICODIN) 5-325 MG tablet Take 1 tablet by mouth every 4 (four) hours as needed for moderate pain. 09/28/16  Yes Pool, Mallie Mussel, MD  ibuprofen (ADVIL,MOTRIN) 200 MG tablet Take 400 mg by mouth daily as needed for moderate pain.   Yes [provider]  levETIRAcetam (KEPPRA) 500 MG tablet Take 1 tablet (500 mg total) by mouth 2 (two) times daily. 09/28/16  Yes Pool, Mallie Mussel, MD  metFORMIN (GLUCOPHAGE) 500 MG tablet Take 500 mg by mouth 3 (three) times daily with meals.   Yes [provider]  methylPREDNISolone (MEDROL DOSEPAK) 4 MG TBPK tablet follow  package directions 09/28/16  Yes Pool, Mallie Mussel, MD  Multiple Vitamin (MULITIVITAMIN WITH MINERALS) TABS Take 1 tablet by mouth daily.    Yes [provider]  naproxen sodium (ANAPROX) 220 MG tablet Take 220 mg by mouth daily as needed (pain).   Yes [provider]  neomycin-bacitracin-polymyxin (NEOSPORIN) ointment Apply 1 application topically daily as needed for wound care. apply to eye   Yes [provider]  pravastatin (PRAVACHOL) 20 MG tablet Take 20 mg by mouth daily.   Yes [provider]  Vitamins A & D (VITAMIN A & D) ointment Apply 1 application topically as needed (rash).   Yes [provider]    Family History History reviewed. No pertinent family history.  Social History Social History  Substance Use Topics  . Smoking status: Former Smoker    Quit date: 09/18/2010  . Smokeless tobacco: Never Used     Comment: QUIT 3 YEARS AGO-ABOUT 2008--SOCIAL SMOKER ONLY  . Alcohol use No     Allergies   Other   Review of Systems Review of Systems  Constitutional: Negative for fever.  Neurological: Negative for weakness and headaches.       +AMS  All other systems reviewed and are negative.    Physical Exam Updated Vital Signs BP (!) 135/98   Pulse 65   Temp 98.9 F (37.2 C) (Oral)   Resp 16   Ht 5\' 10"  (1.778 m)   Wt 99.6 kg (219 lb 9.6 oz)   SpO2 94%   BMI 31.51 kg/m   Physical Exam  Constitutional: She is oriented to person, place, and time. She appears well-developed and well-nourished. No distress.  HENT:  Head: Normocephalic and atraumatic.  Right Ear: Hearing normal.  Left Ear: Hearing normal.  Nose: Nose normal.  Mouth/Throat: Oropharynx is clear and moist and mucous membranes are normal.  Eyes: Conjunctivae and EOM are normal. Pupils are equal, round, and reactive to light.  Neck: Normal range of motion. Neck supple.  Cardiovascular: Regular rhythm, S1 normal and S2 normal.  Exam reveals no gallop and no friction  rub.   No murmur heard. Pulmonary/Chest: Effort normal and breath sounds normal. No respiratory distress. She exhibits no tenderness.  Abdominal: Soft. Normal appearance and bowel sounds are normal. There is no hepatosplenomegaly. There is no tenderness. There is no rebound, no guarding, no tenderness at McBurney's point and negative Murphy's sign. No hernia.  Musculoskeletal: Normal range of motion.  Neurological: She is alert and oriented to person, place, and time. She has normal strength. No sensory deficit. Coordination normal. GCS eye subscore is 4. GCS verbal subscore is 5. GCS motor subscore is 6.  Right sided facial droop noted  Skin: Skin is warm, dry and intact. No rash noted. No cyanosis.  Psychiatric: She has a normal mood and affect. Her speech is normal and behavior is normal. Thought content normal.  Nursing note and vitals reviewed.    ED Treatments / Results  DIAGNOSTIC STUDIES:  Oxygen Saturation is 97% on RA, normal by my interpretation.    COORDINATION OF CARE:  2:24 AM Discussed treatment plan with pt at bedside and pt agreed to plan.  Labs (all labs ordered are listed, but only abnormal results are displayed) Labs Reviewed  COMPREHENSIVE METABOLIC PANEL - Abnormal; Notable for the following:       Result Value   Sodium 134 (*)    Chloride 96 (*)    Glucose, Bld 147 (*)    Total Bilirubin 1.3 (*)    All other components within normal limits  URINALYSIS, ROUTINE W REFLEX MICROSCOPIC - Abnormal; Notable for the following:    Bacteria, UA RARE (*)    Squamous Epithelial / LPF 0-5 (*)    All other components within normal limits  CBC    EKG  EKG Interpretation  Date/Time:  Thursday Oct 03 2016 02:20:30 EDT Ventricular Rate:  57 PR Interval:    QRS Duration: 97 QT Interval:  390 QTC Calculation: 380 R Axis:   36 Text Interpretation:  Sinus rhythm Abnormal R-wave progression, early transition Baseline wander in lead(s) II III aVF Otherwise within  normal limits Confirmed by Orpah Greek [phone]) on 10/03/2016 4:39:34 AM       Radiology No results found.  Procedures Procedures (including critical care time)  Medications Ordered in ED Medications  morphine 4 MG/ML injection 4 mg (4 mg Intravenous Given 10/03/16 0556)  ondansetron (ZOFRAN) injection 4 mg (4 mg Intravenous Given 10/03/16 0556)     Initial Impression / Assessment and Plan / ED Course  I have reviewed the triage vital signs and the nursing notes.  Pertinent labs & imaging results that were available during my care of the patient were reviewed by me and considered in my medical decision making (see chart for details).     Patient transferred from [facility] for further evaluation. Patient recently had surgery for removal of meningioma here at [facility]. She was seen in the ER tonight for mental status changes. She apparently had confusion as well as difficulty with speech. By the time she arrived in this ER, however, symptoms are resolved and she is at her baseline. Family does not describe classic seizure symptoms, but seizure would be a strong consideration for etiology of symptoms that have now resolved. TIA, CVA and other postoperative complications are also considered. Patient is currently on Keppra for seizure prophylaxis.  Discussed with Vinnie, physician assistant on call for Neurosurgery. It was recommended the patient undergo MRI for further evaluation. Study is pending at this time. Will sign out to oncoming ER Physician to follow up MRI and contact neurosurgery.  Final Clinical Impressions(s) / ED Diagnoses   Final diagnoses:  Mental status change resolved    New Prescriptions New Prescriptions   No medications on file   I personally performed the services described in this documentation, which was scribed in my presence. The recorded information has been reviewed and is accurate.     Orpah Greek,  MD 10/03/16 [phone]

## 2016-10-03 NOTE — ED Notes (Signed)
Called MRI at patient's request regarding update on when she will be picked up. Per MRI tech, should be within an hour. Pt notified.

## 2016-10-03 NOTE — ED Notes (Signed)
Patient currently in MRI sister in room introduced myself to her

## 2016-10-03 NOTE — ED Notes (Signed)
Updated pt on delays. Pollina MD at bedside.

## 2016-10-03 NOTE — ED Notes (Signed)
Pt c/o pain in lower extremities now. MD notified. See new orders.

## 2016-10-03 NOTE — ED Notes (Signed)
Called MRI at patient's request regarding update on when she will be taken to MRI. Per MRI tech, unable to give time estimate for transport.

## 2016-10-03 NOTE — Consult Note (Signed)
Reason for Consult:possible seizure, acute mental status change Referring Physician: [name], [name] is an 51 y.o. female.  HPI: [name] presented to [facility] with decreased mental status , transferred to [facility] for further evaluation MRI showed no acute changes, some spinal fluid present underneath the flap. The flap is clean, dry no  Signs of infection. Currently according to family she is at baseline mentally.  Past Medical History:  Diagnosis Date  . 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 Diabetes mellitus without complication ([name])   . Dysrhythmia    PT STATES SHE HAS AN EXTRA HEART BEAT AT TIMES--FIRST NOTICED BY HER GYN  DR. Cletis Media COUPLE OF YRS AGO--PT STATES SHE WAS SENT FOR STRESS TEST-TREADMILL.  -PT DOESN'T HAVE ANY OTHER INFORMATION.  Marland Kitchen Headache   . Hypertension    B/P ELEVATED LAST 3 DOCTOR VISITS--BUT NO PRIOR HX AND NOT ON B/P MEDS  . Meningioma ([name])    HX OF ELEVATED PROLACTIN LEVELS-FOUND TO HAVE MENINGIOMA ADJACENT TO PITITUARY GLAND--NO OTHER PROBLEMS ASSOC WITH THE TUMOR--NO SURGERY NEEDED--PT SEES DR. [name] CABELL IN [place] ONCE A YEAR    Past Surgical History:  Procedure Laterality Date  . CESAREAN SECTION  06/1990  . CRANIOTOMY Right 09/25/2016   Procedure: PTERIONAL CRANIOTOMY  FOR TUMOR;  Surgeon: Ashok Pall, MD;  Location: [facility];  Service: Neurosurgery;  Laterality: Right;  . CYST REMOVED BOTH HANDS  1997 OR 1998  . PARTIAL KNEE ARTHROPLASTY  06/24/2011   Procedure: UNICOMPARTMENTAL KNEE;  Surgeon: Mauri Pole, MD;  Location[facility];  Service: Orthopedics;  Laterality: Right;  . RIGHT KNEE ARTHROSCOPY  1984    History reviewed. No pertinent family history.  Social History:  reports that she quit smoking about 6 years ago. She has never used smokeless tobacco. She reports that she does not drink alcohol or use drugs.  Allergies:  Allergies  Allergen Reactions  . Other     All Meat  products, pt is strictly a vegetarian     Medications: I have reviewed the patient's current medications.  Results for orders placed or performed during the hospital encounter of 10/03/16 (from the past 48 hour(s))  CBC     Status: None   Collection Time: 10/03/16  2:47 AM  Result Value Ref Range   WBC 10.3 4.0 - 10.5 K/uL   RBC 4.62 3.87 - 5.11 MIL/uL   Hemoglobin 12.8 12.0 - 15.0 g/dL   HCT 39.9 36.0 - 46.0 %   MCV 86.4 78.0 - 100.0 fL   MCH 27.7 26.0 - 34.0 pg   MCHC 32.1 30.0 - 36.0 g/dL   RDW 13.0 11.5 - 15.5 %   Platelets 336 150 - 400 K/uL  Comprehensive metabolic panel     Status: Abnormal   Collection Time: 10/03/16  2:47 AM  Result Value Ref Range   Sodium 134 (L) 135 - 145 mmol/L   Potassium 4.4 3.5 - 5.1 mmol/L    Comment: SPECIMEN HEMOLYZED. HEMOLYSIS MAY AFFECT INTEGRITY OF RESULTS.   Chloride 96 (L) 101 - 111 mmol/L   CO2 29 22 - 32 mmol/L   Glucose, Bld 147 (H) 65 - 99 mg/dL   BUN 12 6 - 20 mg/dL   Creatinine, Ser 0.94 0.44 - 1.00 mg/dL   Calcium 9.3 8.9 - 10.3 mg/dL   Total Protein 7.6 6.5 - 8.1 g/dL   Albumin 3.7 3.5 - 5.0 g/dL   AST  27 15 - 41 U/L   ALT 22 14 - 54 U/L   Alkaline Phosphatase 75 38 - 126 U/L   Total Bilirubin 1.3 (H) 0.3 - 1.2 mg/dL   GFR calc non Af Amer >60 >60 mL/min   GFR calc Af Amer >60 >60 mL/min    Comment: (NOTE) The eGFR has been calculated using the CKD EPI equation. This calculation has not been validated in all clinical situations. eGFR's persistently <60 mL/min signify possible Chronic Kidney Disease.    Anion gap 9 5 - 15  Urinalysis, Routine w reflex microscopic     Status: Abnormal   Collection Time: 10/03/16  3:50 AM  Result Value Ref Range   Color, Urine YELLOW YELLOW   APPearance CLEAR CLEAR   Specific Gravity, Urine 1.019 1.005 - 1.030   pH 7.0 5.0 - 8.0   Glucose, UA NEGATIVE NEGATIVE mg/dL   Hgb urine dipstick NEGATIVE NEGATIVE   Bilirubin Urine NEGATIVE NEGATIVE   Ketones, ur NEGATIVE NEGATIVE mg/dL    Protein, ur NEGATIVE NEGATIVE mg/dL   Nitrite NEGATIVE NEGATIVE   Leukocytes, UA NEGATIVE NEGATIVE   RBC / HPF 0-5 0 - 5 RBC/hpf   WBC, UA 0-5 0 - 5 WBC/hpf   Bacteria, UA RARE (A) NONE SEEN   Squamous Epithelial / LPF 0-5 (A) NONE SEEN   Mucous PRESENT     Mr [name] And Wo Contrast  Result Date: 10/03/2016 CLINICAL DATA:  Mental status changes. Symptoms now resolved. Recent craniotomy for meningioma and debulking. EXAM: MRI HEAD WITHOUT AND WITH CONTRAST TECHNIQUE: Multiplanar, multiecho pulse sequences of the brain and surrounding structures were obtained without and with intravenous contrast. CONTRAST:  36m MULTIHANCE GADOBENATE DIMEGLUMINE 529 MG/ML IV SOLN COMPARISON:  09/26/2016 FINDINGS: Brain: Sequelae of right pterional craniotomy for meningioma resection are again identified. Small volume restricted diffusion in the anterior right temporal lobe on the prior study has decreased, though there is some persistent cortical edema in this location as well as increased enhancement along the cortex and margins of the resection cavity. There is new cortically based confluent restricted diffusion involving the anterior and lateral right frontal lobe with some gyral edema. Mild diffusion abnormality and edema are also present in the right insula and right hippocampus. Pneumocephalus has largely resolved. There are small subdural fluid collections/hematomas overlying both cerebral hemispheres measuring up to 6 mm in thickness bilaterally, with the volume of subdural fluid being slightly greater on the right than on the left. There is no significant midline shift. The right lateral ventricle remains mildly compressed. Trace subdural blood is present along the posterior inferior falx on the left. Extra-axial fluid deep to the craniotomy has slightly increased in volume, and there is also increased fluid overlying the craniotomy site in the scalp. Increased, mild dural enhancement overlying the right  cerebral convexity and increased enhancement along the margins of the middle cranial fossa resection cavity may be postoperative. Residual enhancing tumor in the medial right middle cranial fossa, cavernous sinus, Meckel's cave, sella, and along the posterior clivus extending towards the right cerebellopontine angle and porus acusticus does not appear substantially changed from the recent postoperative MRI. Vascular: Major intracranial vascular flow voids are preserved. Mild narrowing of the right ICA siphon by skullbase tumor is again noted. Skull and upper cervical spine: Craniotomy changes as above. Sinuses/Orbits: Globes appear intact. Mild right mastoid edema. No significant paranasal sinus disease. Other: Right scalp skin staples remain in place. IMPRESSION: 1. New diffusion abnormality and gyral edema  in the right frontal lobe, insula, and right hippocampus likely reflecting recent seizure activity rather than progressive infarct. 2. Increased enhancement along the anterior right temporal lobe, resection cavity margins, and over the right cerebral convexity, likely reflecting progressive postoperative changes and evolution of small amount of previously seen anterior right temporal lobe ischemia. 3. Unchanged residual skullbase meningioma as detailed on the prior immediate postoperative study. 4. Mildly increased extra-axial fluid deep to the craniotomy site which is contiguous with an overlying scalp fluid collection. CSF leak and infection are not excluded by imaging. Electronically Signed   By: Logan Bores M.D.   On: 10/03/2016 08:58    Review of Systems  Constitutional: Negative.   HENT: Negative.   Eyes: Negative.   Respiratory: Negative.   Cardiovascular: Negative.   Skin: Negative.   Neurological: Positive for seizures.       Right peripheral facial nerve paLSY  Psychiatric/Behavioral: Negative.    Blood pressure (!) 139/97, pulse 63, temperature 98.9 F (37.2 C), temperature source  Oral, resp. rate 14, height 5' 10"  (1.778 m), weight 99.6 kg (219 lb 9.6 oz), SpO2 98 %. Physical Exam  Constitutional: She is oriented to person, place, and time. She appears well-developed and well-nourished. No distress.  HENT:  Head: Normocephalic.  Neurological: She is alert and oriented to person, place, and time. She has normal strength and normal reflexes. She displays normal reflexes. A cranial nerve deficit is present. No sensory deficit. Coordination normal. She displays no Babinski's sign on the right side. She displays no Babinski's sign on the left side.  Wound is healing well, small amount of spinal fluid underneath flap Right peripheral 7th nerve Tongue and uvula midline No drift Perrl, full eom Speech is clear and fluent    Assessment/Plan: Ok for discharge, staples removed Told to increase keppra by one pill 520m q day. I will send extra from office Mental status is now normal , I will make an appointment for her in two weeks. Darrly Loberg L 10/03/2016, 10:21 AM

## 2016-10-03 NOTE — ED Provider Notes (Signed)
Received care of patient at [facility] from Dr. Betsey Holiday.  Please see his note for prior hx and physical. Briefly this is a 51yo female with history of resection of meningioma on 5/16 who presents from [facility] with concern for episode of altered consciousness, difficulty speaking now returned to baseline. MR ordered and pending.  MR shows signs of likely recent seizure, mildly increased extra-axial fluid.  Dr. Christella Noa came to bedside, evaluated the patient and removed staples. Recommend increase in keppra for suspected seizure. Patient discharged in stable condition with understanding of reasons to return.     Gareth Morgan, MD 10/04/16 1049

## 2016-10-14 ENCOUNTER — Encounter (HOSPITAL_COMMUNITY): Payer: Self-pay | Admitting: *Deleted

## 2016-10-14 ENCOUNTER — Emergency Department (HOSPITAL_COMMUNITY)
Admission: EM | Admit: 2016-10-14 | Discharge: 2016-10-14 | Disposition: A | Discharge status: Home / Self Care | Payer: BLUE CROSS/BLUE SHIELD | Attending: Emergency Medicine | Admitting: Emergency Medicine

## 2016-10-14 DIAGNOSIS — I10 Essential (primary) hypertension: Secondary | ICD-10-CM | POA: Diagnosis not present

## 2016-10-14 DIAGNOSIS — Z79899 Other long term (current) drug therapy: Secondary | ICD-10-CM | POA: Insufficient documentation

## 2016-10-14 DIAGNOSIS — Z87891 Personal history of nicotine dependence: Secondary | ICD-10-CM | POA: Insufficient documentation

## 2016-10-14 DIAGNOSIS — H16001 Unspecified corneal ulcer, right eye: Secondary | ICD-10-CM

## 2016-10-14 DIAGNOSIS — E119 Type 2 diabetes mellitus without complications: Secondary | ICD-10-CM | POA: Diagnosis not present

## 2016-10-14 DIAGNOSIS — H578 Other specified disorders of eye and adnexa: Secondary | ICD-10-CM | POA: Diagnosis present

## 2016-10-14 DIAGNOSIS — Z7984 Long term (current) use of oral hypoglycemic drugs: Secondary | ICD-10-CM | POA: Insufficient documentation

## 2016-10-14 MED ORDER — MOXIFLOXACIN HCL 0.5 % OP SOLN: 1.0000 [drp] | Freq: Four times a day (QID) | OPHTHALMIC | 0 refills | Status: DC

## 2016-10-14 MED ORDER — FLUORESCEIN SODIUM 0.6 MG OP STRP
1.0000 | ORAL_STRIP | Freq: Once | OPHTHALMIC | Status: AC
  Administered 2016-10-14: 1 via OPHTHALMIC
  Filled 2016-10-14: qty 1

## 2016-10-14 MED ORDER — TETRACAINE HCL 0.5 % OP SOLN
2.0000 [drp] | Freq: Once | OPHTHALMIC | Status: AC
  Administered 2016-10-14: 2 [drp] via OPHTHALMIC
  Filled 2016-10-14: qty 2

## 2016-10-14 MED ORDER — ERYTHROMYCIN 5 MG/GM OP OINT
1.0000 "application " | TOPICAL_OINTMENT | Freq: Once | OPHTHALMIC | Status: AC
  Administered 2016-10-14: 1 via OPHTHALMIC
  Filled 2016-10-14: qty 3.5

## 2016-10-14 NOTE — Discharge Instructions (Signed)
Your have been give Erythromycin ointment for your eye use it 2-3 times daily You have been given a prescription for Vigamox drops 1 drop to the right eye 4 times daily Dr. Posey Pronto is setting an appointment with a cornea specialist for Wednesday and will call you with the time

## 2016-10-14 NOTE — ED Triage Notes (Signed)
Pt reports hx of craniotomy on 5/16, having changes to right eye since the surgery. Reports having discharge from her eye and vision changes.

## 2016-10-14 NOTE — Consult Note (Signed)
[name]                                                                               10/14/2016   Ophthalmology Consultation                                         Consult requested by: Dr. Alvino Burton  Reason for consultation:  Right eye redness with corneal opacity.  HPI: 51 year old female historty of meningioma removal 09/25/16.  Several post-op complications including seizure activity.    On 10/10/16 she noted redness in her right eye.  She states she had 'cold' in her eye and used a warm compress last week to wipe it away (her eye were stuck together and the warm compress allowed her to wipe away and loosen the 'cold'.   She states the redness worsened from 6/1 to 6/2 when she noticed an opaque film over the eye.  She states the 'film' has worsened.    She unsuccessfully has tried to contact her neurosurgeon.  She states that she has no sensation on the right side of her face.  Denies any pain in the eye, or headache.    Pertinent Medical History:   Active Ambulatory Problems    Diagnosis Date Noted  . Preop cardiovascular exam 06/21/2011  . PVC (premature ventricular contraction) 06/21/2011  . Right knee pain 06/21/2011  . S/P right unicompartmental knee replacement 06/24/2011  . Meningioma determined by biopsy of brain ([place]) 09/25/2016   Resolved Ambulatory Problems    Diagnosis Date Noted  . No Resolved Ambulatory Problems   Past Medical History:  Diagnosis Date  . Arthritis   . Diabetes mellitus without complication ([place])   . Dysrhythmia   . Headache   . Hypertension   . Meningioma [facility])       Pertinent Ophthalmic History: None    (Previously seen at [facility] in [place], [place]  Current [place] Medications:    Systemic medications on admission:   (Not in a hospital admission)      ROS:    Visual Fields: FTC OU       Pupils:  no APD      Near acuity:   [place]   CF at 3 ft     OD     [place]   20/40     OS    TA:       OD: 14 mmhg       External:   OD:   ptosis     OS:  Normal      Anterior segment exam:  By slit lap      Conjunctiva:  OD:  3+ injection     OS:  Quiet     Cornea:    OD: Central corneal abrasion with associated corneal edema and SPK       OS: Clear, no fluorescein stain      Anterior Chamber:   OD:  Unable to determine AC inflammation     OS:  Deep/quiet     Iris:    OD:  Normal  OS:  Normal      Lens:    OD:  Clear         OS:  Clear          Fundus exam:  OD  - hazy view  Impression:  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    Start Vigamox QID and Erythromycin TID until appointment with Dr. Patrice Burton on 10/16/16  Very long discussion with patient and sister regarding treatment options including conservative patching vs tarsorrhaphy.  >45 min and 50% of exam time.  I've discussed these findings with the nurse and/or resident. Please contact our office with any questions or concerns at [phone].  [name]

## 2016-10-14 NOTE — ED Provider Notes (Signed)
[facility] DEPT Provider Note   CSN: [id number] Arrival date & time: 10/14/16  1617     History   Chief Complaint Chief Complaint  Patient presents with  . Eye Problem    HPI [name] is a 51 y.o. female.  This a 51 year old female who had a meningioma removed on May 16.  Since that time she's had several complications including seizure activity.  She is currently taking Keppra. She noticed on Thursday of last week, that she's had some injection of the sclera of her right eye on Friday.  The injection was worse on Saturday she started having decreased vision and she noticed an opaque film over the pupil of the right eye.  This has progressively gotten worse. She has tried multiple times to contact her neurosurgeon , to no avail. Patient does have postoperative numbness and decreased sensation on that side of her face.  Denies any pain in the eye, or headache.  No nausea or vomiting She states that she does notice a crusting of the lid margin when she lays down      Past Medical History:  Diagnosis Date  . 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 Diabetes mellitus without complication ([place])   . Dysrhythmia    PT STATES SHE HAS AN EXTRA HEART BEAT AT TIMES--FIRST NOTICED BY HER GYN  DR. Cletis Media COUPLE OF YRS AGO--PT STATES SHE WAS SENT FOR STRESS TEST-TREADMILL.  -PT DOESN'T HAVE ANY OTHER INFORMATION.  Marland Kitchen Headache   . Hypertension    B/P ELEVATED LAST 3 DOCTOR VISITS--BUT NO PRIOR HX AND NOT ON B/P MEDS  . Meningioma ([place])    HX OF ELEVATED PROLACTIN LEVELS-FOUND TO HAVE MENINGIOMA ADJACENT TO PITITUARY GLAND--NO OTHER PROBLEMS ASSOC WITH THE TUMOR--NO SURGERY NEEDED--PT SEES DR. [name] CABELL IN [place] ONCE A YEAR    Patient Active Problem List   Diagnosis Date Noted  . Meningioma determined by biopsy of brain ([place]) 09/25/2016  . S/P right unicompartmental knee replacement 06/24/2011  . Preop cardiovascular exam 06/21/2011   . PVC (premature ventricular contraction) 06/21/2011  . Right knee pain 06/21/2011    Past Surgical History:  Procedure Laterality Date  . CESAREAN SECTION  06/1990  . CRANIOTOMY Right 09/25/2016   Procedure: PTERIONAL CRANIOTOMY  FOR TUMOR;  Surgeon: Ashok Pall, MD;  Location: [facility];  Service: Neurosurgery;  Laterality: Right;  . CYST REMOVED BOTH HANDS  1997 OR 1998  . PARTIAL KNEE ARTHROPLASTY  06/24/2011   Procedure: UNICOMPARTMENTAL KNEE;  Surgeon: Mauri Pole, MD;  Location: [facility];  Service: Orthopedics;  Laterality: Right;  . RIGHT KNEE ARTHROSCOPY  1984    OB History    No data available       Home Medications    Prior to Admission medications   Medication Sig Start Date End Date Taking? Authorizing Provider  benazepril (LOTENSIN) 10 MG tablet Take 10 mg by mouth daily.   Yes [provider]  cabergoline (DOSTINEX) 0.5 MG tablet Take 0.5 mg by mouth 2 (two) times a week.   Yes [provider]  Cholecalciferol (VITAMIN D PO) Take 1 capsule by mouth daily.   Yes [provider]  Cyanocobalamin (VITAMIN B 12 PO) Take 1 tablet by mouth daily.   Yes [provider]  FOLIC ACID PO Take 1 tablet by mouth daily.   Yes [provider]  HYDROcodone-acetaminophen (NORCO/VICODIN) 5-325 MG tablet Take 1 tablet by mouth every  4 (four) hours as needed for moderate pain. 09/28/16  Yes Pool, Mallie Mussel, MD  levETIRAcetam (KEPPRA) 500 MG tablet Take 1 tablet (500 mg total) by mouth 2 (two) times daily. Patient taking differently: Take 500 mg by mouth 3 (three) times daily.  09/28/16  Yes Pool, Mallie Mussel, MD  Multiple Vitamin (MULITIVITAMIN WITH MINERALS) TABS Take 1 tablet by mouth daily.    Yes [provider]  neomycin-bacitracin-polymyxin (NEOSPORIN) ointment Apply 1 application topically daily as needed for wound care. apply to eye   Yes [provider]  pravastatin (PRAVACHOL) 20 MG tablet Take 20 mg by mouth daily.   Yes  [provider]  sitaGLIPtin (JANUVIA) 100 MG tablet Take 100 mg by mouth daily.   Yes [provider]  metFORMIN (GLUCOPHAGE) 500 MG tablet Take 500 mg by mouth 3 (three) times daily with meals.    [provider]  methylPREDNISolone (MEDROL DOSEPAK) 4 MG TBPK tablet follow package directions Patient not taking: Reported on 10/14/2016 09/28/16   Earnie Larsson, MD  moxifloxacin (VIGAMOX) 0.5 % ophthalmic solution Place 1 drop into the right eye 4 (four) times daily. 10/14/16   Junius Creamer, NP    Family History History reviewed. No pertinent family history.  Social History Social History  Substance Use Topics  . Smoking status: Former Smoker    Quit date: 09/18/2010  . Smokeless tobacco: Never Used     Comment: QUIT 3 YEARS AGO-ABOUT 2008--SOCIAL SMOKER ONLY  . Alcohol use No     Allergies   Other   Review of Systems Review of Systems  Constitutional: Negative for fever.  HENT: Negative for congestion.   Eyes: Positive for discharge, redness and visual disturbance. Negative for photophobia, pain and itching.  Neurological: Positive for facial asymmetry. Negative for dizziness and headaches.  All other systems reviewed and are negative.    Physical Exam Updated Vital Signs BP (!) 146/95   Pulse 80   Temp 98.4 F (36.9 C) (Oral)   Resp 18   SpO2 98%   Physical Exam  Constitutional: She appears well-developed and well-nourished. No distress.  HENT:  Head: Normocephalic.  Right Ear: External ear normal.  Left Ear: External ear normal.  Eyes: EOM are normal. Right eye exhibits discharge. Right conjunctiva is injected. Right eye exhibits normal extraocular motion and no nystagmus. Left eye exhibits normal extraocular motion and no nystagmus.  Slit lamp exam:      The right eye shows no corneal abrasion and no fluorescein uptake.    Eye pressure, right eye.  Average of 14  Cardiovascular: Normal rate.   Pulmonary/Chest: Effort normal.  Abdominal:  Soft.  Musculoskeletal: Normal range of motion.  Neurological: She is alert.  Skin: Skin is warm.  Psychiatric: She has a normal mood and affect.  Nursing note and vitals reviewed.    ED Treatments / Results  Labs (all labs ordered are listed, but only abnormal results are displayed) Labs Reviewed - No data to display  EKG  EKG Interpretation None       Radiology No results found.  Procedures Procedures (including critical care time)  Medications Ordered in ED Medications  erythromycin ophthalmic ointment 1 application (not administered)  tetracaine (PONTOCAINE) 0.5 % ophthalmic solution 2 drop (2 drops Right Eye Given by Other 10/14/16 2313)  fluorescein ophthalmic strip 1 strip (1 strip Right Eye Given by Other 10/14/16 2314)     Initial Impression / Assessment and Plan / ED Course  I have reviewed the triage  vital signs and the nursing notes.  Pertinent labs & imaging results that were available during my care of the patient were reviewed by me and considered in my medical decision making (see chart for details).      Dr. Posey Pronto examined patient ans is setting FU with cornea specialist for Wednesday   Final Clinical Impressions(s) / ED Diagnoses   Final diagnoses:  Ulcer of right cornea    New Prescriptions New Prescriptions   MOXIFLOXACIN (VIGAMOX) 0.5 % OPHTHALMIC SOLUTION    Place 1 drop into the right eye 4 (four) times daily.     Junius Creamer, NP 10/14/16 2328    Davonna Belling, MD 10/15/16 [phone]

## 2016-10-15 NOTE — Addendum Note (Signed)
Encounter addended by: Ronnette Hila, RN on: 10/15/2016  3:35 PM<BR>    Actions taken: Delete clinical note

## 2016-10-29 NOTE — Progress Notes (Signed)
Consult visit cancelled by referring MD for now to permit further recovery

## 2016-10-30 ENCOUNTER — Telehealth: Payer: Self-pay | Admitting: Radiation Oncology

## 2016-10-30 ENCOUNTER — Ambulatory Visit
Admission: RE | Admit: 2016-10-30 | Discharge: 2016-10-30 | Disposition: A | Discharge status: Home / Self Care | Payer: BLUE CROSS/BLUE SHIELD | Source: Ambulatory Visit | Attending: Radiation Oncology | Admitting: Radiation Oncology

## 2016-10-30 ENCOUNTER — Ambulatory Visit: Payer: BLUE CROSS/BLUE SHIELD

## 2016-10-30 DIAGNOSIS — E119 Type 2 diabetes mellitus without complications: Secondary | ICD-10-CM | POA: Insufficient documentation

## 2016-10-30 DIAGNOSIS — R569 Unspecified convulsions: Secondary | ICD-10-CM | POA: Insufficient documentation

## 2016-10-30 DIAGNOSIS — Z9889 Other specified postprocedural states: Secondary | ICD-10-CM | POA: Insufficient documentation

## 2016-10-30 DIAGNOSIS — N63 Unspecified lump in unspecified breast: Secondary | ICD-10-CM | POA: Insufficient documentation

## 2016-10-30 DIAGNOSIS — M170 Bilateral primary osteoarthritis of knee: Secondary | ICD-10-CM | POA: Insufficient documentation

## 2016-10-30 DIAGNOSIS — Z510 Encounter for antineoplastic radiation therapy: Secondary | ICD-10-CM | POA: Insufficient documentation

## 2016-10-30 DIAGNOSIS — R200 Anesthesia of skin: Secondary | ICD-10-CM | POA: Insufficient documentation

## 2016-10-30 DIAGNOSIS — D320 Benign neoplasm of cerebral meninges: Secondary | ICD-10-CM | POA: Insufficient documentation

## 2016-10-30 DIAGNOSIS — I10 Essential (primary) hypertension: Secondary | ICD-10-CM | POA: Insufficient documentation

## 2016-10-30 DIAGNOSIS — Z79899 Other long term (current) drug therapy: Secondary | ICD-10-CM | POA: Insufficient documentation

## 2016-10-30 DIAGNOSIS — Z87891 Personal history of nicotine dependence: Secondary | ICD-10-CM | POA: Insufficient documentation

## 2016-10-30 NOTE — Telephone Encounter (Signed)
Patient has not shown for 1330 appointment. Phoned patient's home to inquire. Patient reports she was unaware of the appointment. Patient understands this RN will have Anise Salvo contact her to reschedule.

## 2016-10-31 ENCOUNTER — Ambulatory Visit: Payer: BLUE CROSS/BLUE SHIELD | Admitting: Radiation Oncology

## 2016-11-06 ENCOUNTER — Ambulatory Visit: Payer: BLUE CROSS/BLUE SHIELD | Admitting: Radiation Oncology

## 2016-11-06 ENCOUNTER — Ambulatory Visit: Payer: BLUE CROSS/BLUE SHIELD

## 2016-12-11 ENCOUNTER — Other Ambulatory Visit: Payer: Self-pay | Admitting: Radiation Therapy

## 2016-12-11 DIAGNOSIS — D320 Benign neoplasm of cerebral meninges: Secondary | ICD-10-CM

## 2016-12-18 ENCOUNTER — Other Ambulatory Visit: Payer: BLUE CROSS/BLUE SHIELD

## 2016-12-23 ENCOUNTER — Ambulatory Visit: Payer: BLUE CROSS/BLUE SHIELD | Admitting: Radiation Oncology

## 2016-12-23 ENCOUNTER — Ambulatory Visit: Payer: BLUE CROSS/BLUE SHIELD

## 2016-12-25 ENCOUNTER — Other Ambulatory Visit: Payer: BLUE CROSS/BLUE SHIELD

## 2016-12-25 ENCOUNTER — Ambulatory Visit
Admission: RE | Admit: 2016-12-25 | Discharge: 2016-12-25 | Disposition: A | Discharge status: Home / Self Care | Payer: BLUE CROSS/BLUE SHIELD | Source: Ambulatory Visit | Attending: Radiation Oncology | Admitting: Radiation Oncology

## 2016-12-25 DIAGNOSIS — D320 Benign neoplasm of cerebral meninges: Secondary | ICD-10-CM

## 2016-12-25 MED ORDER — GADOBENATE DIMEGLUMINE 529 MG/ML IV SOLN
20.0000 mL | Freq: Once | INTRAVENOUS | Status: AC | PRN
  Administered 2016-12-25: 20 mL via INTRAVENOUS

## 2016-12-27 ENCOUNTER — Encounter: Payer: Self-pay | Admitting: Radiation Oncology

## 2016-12-27 NOTE — Progress Notes (Signed)
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 The meningioma extends along the clivus on the right side.  Patient presented with symptoms of:  Slight decrease to V1, V2, V3 with right facial movements prior to craniotomy.  Past or anticipated interventions, if any, per neurosurgery: Craniotomy performed by Dr. Christella Noa on 09/25/2016.  Past or anticipated interventions, if any, per medical oncology: no  Dose of Decadron, if applicable: no  Recent neurologic symptoms, if any:   Seizures: Explains she had a seizure one week to the day s/p surgery. Reports following seizure her Keppra was increased from 500 mg one daily to tid. Patient denies any seizure activity since.  Headaches: Reports intermittent headaches that are less intense than prior to surgery.  Nausea: no  Dizziness/ataxia: no  Difficulty with hand coordination: no  Focal numbness/weakness: Yes, facial  Visual deficits/changes: Right eye ptosis noted. Patient explains that debris got in her right eye following her craniotomy. She explains the debris scratched her cornea. Reports part of her right eye has been stitched closed and will remain this way until after xrt to protect her eye and allow the cornea to finish healing. She explains her vision is slowly coming back.    Confusion/Memory deficits: No  Painful bone metastases at present, if any: no  SAFETY ISSUES:  Prior radiation? no  Pacemaker/ICD? no  Possible current pregnancy? no  Is the patient on methotrexate? no  Additional Complaints / other details: 51 year old female. Diagnosed back in 2010-2011 with meningioma. Patient was referred by Dr. Christella Noa for fractionated treatment post operatively.

## 2016-12-30 ENCOUNTER — Ambulatory Visit: Payer: BLUE CROSS/BLUE SHIELD | Admitting: Radiation Oncology

## 2016-12-30 ENCOUNTER — Ambulatory Visit
Admission: RE | Admit: 2016-12-30 | Discharge: 2016-12-30 | Disposition: A | Discharge status: Home / Self Care | Payer: BLUE CROSS/BLUE SHIELD | Source: Ambulatory Visit | Attending: Radiation Oncology | Admitting: Radiation Oncology

## 2016-12-30 ENCOUNTER — Ambulatory Visit: Payer: BLUE CROSS/BLUE SHIELD

## 2016-12-30 ENCOUNTER — Encounter: Payer: Self-pay | Admitting: Radiation Oncology

## 2016-12-30 VITALS — BP 150/90 | HR 76 | Temp 98.0°F | Resp 18 | Ht 70.0 in | Wt 202.2 lb

## 2016-12-30 DIAGNOSIS — M170 Bilateral primary osteoarthritis of knee: Secondary | ICD-10-CM | POA: Diagnosis not present

## 2016-12-30 DIAGNOSIS — Z510 Encounter for antineoplastic radiation therapy: Secondary | ICD-10-CM | POA: Diagnosis not present

## 2016-12-30 DIAGNOSIS — D320 Benign neoplasm of cerebral meninges: Secondary | ICD-10-CM

## 2016-12-30 DIAGNOSIS — E119 Type 2 diabetes mellitus without complications: Secondary | ICD-10-CM | POA: Diagnosis not present

## 2016-12-30 DIAGNOSIS — R569 Unspecified convulsions: Secondary | ICD-10-CM

## 2016-12-30 DIAGNOSIS — N63 Unspecified lump in unspecified breast: Secondary | ICD-10-CM | POA: Diagnosis not present

## 2016-12-30 DIAGNOSIS — E230 Hypopituitarism: Secondary | ICD-10-CM

## 2016-12-30 DIAGNOSIS — D496 Neoplasm of unspecified behavior of brain: Secondary | ICD-10-CM

## 2016-12-30 DIAGNOSIS — Z9889 Other specified postprocedural states: Secondary | ICD-10-CM | POA: Diagnosis not present

## 2016-12-30 DIAGNOSIS — Z87891 Personal history of nicotine dependence: Secondary | ICD-10-CM | POA: Diagnosis not present

## 2016-12-30 DIAGNOSIS — R200 Anesthesia of skin: Secondary | ICD-10-CM | POA: Diagnosis not present

## 2016-12-30 DIAGNOSIS — I10 Essential (primary) hypertension: Secondary | ICD-10-CM | POA: Diagnosis not present

## 2016-12-30 DIAGNOSIS — Z79899 Other long term (current) drug therapy: Secondary | ICD-10-CM | POA: Diagnosis not present

## 2016-12-30 MED ORDER — SODIUM CHLORIDE 0.9% FLUSH
10.0000 mL | Freq: Once | INTRAVENOUS | Status: AC
  Administered 2016-12-30: 10 mL via INTRAVENOUS

## 2016-12-30 NOTE — Addendum Note (Signed)
Encounter addended by: Heywood Footman, RN on: 12/30/2016 12:24 PM<BR>    Actions taken: Sign clinical note

## 2016-12-30 NOTE — Progress Notes (Signed)
Left AC 22 gauge IV removed by Mardene Sayer, LPN. She reports catheter was intact upon removal. She reports apply a bandaid to the old IV access site.

## 2016-12-30 NOTE — Progress Notes (Addendum)
Has armband been applied?  Yes  Does patient have an allergy to IV contrast dye?: No   Has patient ever received premedication for IV contrast dye?: No  Does patient take metformin?: No; Januiva  If patient does take metformin when was the last dose: N/A  Date of lab work: 12/25/16 prior to MRI at [facility]: - CR: 0.9 gfr=86 IV start with contrast approved per Shona Simpson, PA-C  IV site: Left AC 22 gauge   Has IV site been added to flowsheet?  Yes

## 2016-12-30 NOTE — Progress Notes (Signed)
  Radiation Oncology         [phone] ________________________________  Name: [name] MRN: [id number]  Date: 12/30/2016  DOB: 02-11-1966  SIMULATION AND TREATMENT PLANNING NOTE    ICD-10-CM   1. Meningioma determined by biopsy of brain ([place]) D32.0     DIAGNOSIS:  51 yo woman s/p partial resection of a right sphenoid grade I meningioma  NARRATIVE:  The patient was brought to the [facility].  Identity was confirmed.  All relevant records and images related to the planned course of therapy were reviewed.  The patient freely provided informed written consent to proceed with treatment after reviewing the details related to the planned course of therapy. The consent form was witnessed and verified by the simulation staff. Intravenous access was established for contrast administration. Then, the patient was set-up in a stable reproducible supine position for radiation therapy.  A relocatable thermoplastic stereotactic head frame was fabricated for precise immobilization.  CT images were obtained.  Surface markings were placed.  The CT images were loaded into the planning software and fused with the patient's targeting MRI scan.  Then the target and avoidance structures were contoured.  Treatment planning then occurred.  The radiation prescription was entered and confirmed.  I have requested 3D planning  I have requested a DVH of the following structures: Brain stem, brain, left eye, right eye, lenses, optic chiasm, target volumes, uninvolved brain, and normal tissue.    PLAN:  The patient will receive 50.4 Gy in 28 fraction.  ________________________________  Sheral Apley Tammi Klippel, M.D.

## 2016-12-30 NOTE — Progress Notes (Signed)
Radiation Oncology         [phone] ________________________________  Initial Outpatient Consultation  Name: [name] MRN: [id number]  Date of Service: 12/30/2016 DOB: February 21, 1966  CC:Ocie Bob, FNP  Ashok Pall, MD   REFERRING PHYSICIAN: Ashok Pall, MD  DIAGNOSIS: 51 y.o. female status post partial resection of a right sphenoid grade I meningioma    ICD-10-CM   1. Benign neoplasm of cerebral meninges ([facility]) D32.0    HISTORY OF PRESENT ILLNESS: [name] is a 51 y.o. female seen at the request of Dr. Christella Noa for a diagnosis of grade 1 meningioma. The patient has been followed since 2012 by Dr. Christella Noa for a mass in the cavernous sinus, who also had periodic changes in her visual acuity, numbness in her right cheek and forehead, and who developed discharge from her breast. She was taken to the operating room on 09/25/2016 for craniotomy with resection of her primary tumor. Final pathology revealed a grade 1 meningioma. 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 She reports numbness on the right side of her face for which she says she is currently undergoing acupuncture. She comes today however to review options of postop radiotherapy. Her most recent MRI on 12/25/2016 revealed improvement of her postoperative inflammatory changes but continues to show a 12.4 mm tumor along meckles cave and the cavernous sinus. No new meningioma is noted. She also reports she's seen an endocrinologist previously but cannot recall whom she saw. Dr. [name] is her opthalmologist, and recommended suturing her right eyelid closed after developing a corneal abrasion that became infected around the time of her surgery.  PREVIOUS RADIATION THERAPY: No  PAST MEDICAL HISTORY:  Past Medical History:    Diagnosis Date  . 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 Diabetes mellitus without complication ([place])   . Dysrhythmia    PT STATES SHE HAS AN EXTRA HEART BEAT AT TIMES--FIRST NOTICED BY HER GYN  DR. Cletis Media COUPLE OF YRS AGO--PT STATES SHE WAS SENT FOR STRESS TEST-TREADMILL.  -PT DOESN'T HAVE ANY OTHER INFORMATION.  Marland Kitchen Headache   . Hypertension    B/P ELEVATED LAST 3 DOCTOR VISITS--BUT NO PRIOR HX AND NOT ON B/P MEDS  . Meningioma ([place])    HX OF ELEVATED PROLACTIN LEVELS-FOUND TO HAVE MENINGIOMA ADJACENT TO PITITUARY GLAND--NO OTHER PROBLEMS ASSOC WITH THE TUMOR--NO SURGERY NEEDED--PT SEES DR. [name] CABELL IN [place] ONCE A YEAR      PAST SURGICAL HISTORY: Past Surgical History:  Procedure Laterality Date  . CESAREAN SECTION  06/1990  . CRANIOTOMY Right 09/25/2016   Procedure: PTERIONAL CRANIOTOMY  FOR TUMOR;  Surgeon: Ashok Pall, MD;  Location: [facility];  Service: Neurosurgery;  Laterality: Right;  . CYST REMOVED BOTH HANDS  1997 OR 1998  . PARTIAL KNEE ARTHROPLASTY  06/24/2011   Procedure: UNICOMPARTMENTAL KNEE;  Surgeon: Mauri Pole, MD;  [facility];  Service: Orthopedics;  Laterality: Right;  . RIGHT KNEE ARTHROSCOPY  1984    FAMILY HISTORY: No family history of brain tumors  SOCIAL HISTORY:  Social History   Social History  . Marital status: Single    Spouse name: N/A  . Number of children: N/A  . Years of education: N/A   Occupational History  . Not on file.   Social  History Main Topics  . Smoking status: Former Smoker    Packs/day: 0.25    Years: 4.00    Types: Cigarettes    Quit date: 09/18/2010  . Smokeless tobacco: Never Used     Comment: QUIT 3 YEARS AGO-ABOUT 2008--SOCIAL SMOKER ONLY  . Alcohol use No  . Drug use: No  . Sexual activity: Not Currently   Other Topics Concern  . Not on file   Social History Narrative  . No narrative on file    ALLERGIES: No known allergies and Other  MEDICATIONS:   Current Outpatient Prescriptions  Medication Sig Dispense Refill  . benazepril (LOTENSIN) 10 MG tablet Take 10 mg by mouth daily.    . cabergoline (DOSTINEX) 0.5 MG tablet Take 0.5 mg by mouth 2 (two) times a week.    . Cholecalciferol (VITAMIN D PO) Take 1 capsule by mouth daily.    . Cyanocobalamin (VITAMIN B 12 PO) Take 1 tablet by mouth daily.    Marland Kitchen FOLIC ACID PO Take 1 tablet by mouth daily.    Marland Kitchen levETIRAcetam (KEPPRA) 500 MG tablet Take 1 tablet (500 mg total) by mouth 2 (two) times daily. (Patient taking differently: Take 500 mg by mouth 3 (three) times daily. ) 60 tablet 0  . Multiple Vitamin (MULITIVITAMIN WITH MINERALS) TABS Take 1 tablet by mouth daily.     . pravastatin (PRAVACHOL) 20 MG tablet Take 20 mg by mouth daily.    . sitaGLIPtin (JANUVIA) 100 MG tablet Take 100 mg by mouth daily.     No current facility-administered medications for this encounter.     REVIEW OF SYSTEMS:  On review of systems, the patient reports that she is doing well overall. She denies any chest pain, shortness of breath, cough, fevers, chills, night sweats, or unintended weight changes. She does report recent neurologic symptoms including focal numbness/weakness on the right side of her face, visual deficits/changes, confusion/memory deficits, and occasional headaches related to fatigue. She had a postop seizure and was started on TID dosing of Keppra. Since that time she's been seizure free. She also states that she has trouble chewing food on the right side of her mouth due to loss of sensation, as well as dry mouth. She denies any bowel or bladder disturbances, and denies abdominal pain, nausea or vomiting. She denies any new musculoskeletal or joint aches or pains. A complete review of systems is obtained and is otherwise negative.    PHYSICAL EXAM:  Wt Readings from Last 3 Encounters:  12/30/16 202 lb 3.2 oz (91.7 kg)  10/03/16 219 lb 9.6 oz (99.6 kg)  09/25/16 218 lb (98.9 kg)   Temp Readings  from Last 3 Encounters:  12/30/16 98 F (36.7 C) (Oral)  10/14/16 98.4 F (36.9 C) (Oral)  10/03/16 98.9 F (37.2 C) (Oral)   BP Readings from Last 3 Encounters:  12/30/16 (!) 150/90  10/14/16 (!) 154/97  10/03/16 (!) 139/97   Pulse Readings from Last 3 Encounters:  12/30/16 76  10/14/16 74  10/03/16 63   Pain Assessment Pain Score: 0-No pain/10  In general this is a well appearing African-American woman in no acute distress. She is alert and oriented x4 and appropriate throughout the examination. HEENT reveals that the patient is normocephalic, atraumatic, and her right medial eye lid has one suture intact. She is able to open her eye only about 6-7 mm.  EOMs are intact. PERRLA. Decreased sensation in right forehead and right cheek to light touch but intact at  the right chin and on the left side. Intact strength of the sternocleidomastoid bilaterally and intact strength of the thorax and bilateral upper extremities.    KPS = 90   - Able to carry on normal activity; minor signs or symptoms of disease.  Karnofsky DA, Abelmann [place], Craver LS and Burchenal [facility] [phone]) The use of the nitrogen mustards in the palliative treatment of carcinoma: with particular reference to bronchogenic carcinoma Cancer 1 634-56  LABORATORY DATA:  Lab Results  Component Value Date   WBC 10.3 10/03/2016   HGB 12.8 10/03/2016   HCT 39.9 10/03/2016   MCV 86.4 10/03/2016   PLT 336 10/03/2016   Lab Results  Component Value Date   NA 134 (L) 10/03/2016   K 4.4 10/03/2016   CL 96 (L) 10/03/2016   CO2 29 10/03/2016   Lab Results  Component Value Date   ALT 22 10/03/2016   AST 27 10/03/2016   ALKPHOS 75 10/03/2016   BILITOT 1.3 (H) 10/03/2016     RADIOGRAPHY: Mr [name] ZD Contrast  Result Date: 12/25/2016 CLINICAL DATA:  51 y/o F; follow-up of meningioma resection in 09/2016. Patient to start radiation. Reports of vision loss and numbness on the right side. Creatinine was obtained on site at  [facility] at [address] Results: Creatinine 0.9 mg/dL. EXAM: MRI HEAD WITHOUT AND WITH CONTRAST TECHNIQUE: Multiplanar, multiecho pulse sequences of the brain and surrounding structures were obtained without and with intravenous contrast. CONTRAST:  32mL MULTIHANCE GADOBENATE DIMEGLUMINE 529 MG/ML IV SOLN COMPARISON:  10/17/2016 MRI of the head. FINDINGS: Brain: Stable hemosiderin line resection cavity within right middle cranial fossa with encephalomalacia changes within the right anterior temporal lobe and inferolateral frontal lobe. Interval decrease in fluid collections within the postoperative bed and resolution of enhancement within the brain compatible with interval resolution of postsurgical changes. Skullbase meningioma centered in the right cavernous sinus invading the right orbital fissure, right Meckel's cave, pituitary fossa, right foramen ovale and right aspect of clivus with inferior extension along right anterior aspect of prepontine cistern, and extension along the petroclival ligament to the right tentorium cerebelli. Partial effacement of the right aspect of suprasellar cistern. Enhancing neoplasm envelops the pre chiasmatic right optic nerve to the optic canal. Enhancing neoplasm in develops the distal cavernous segment of the right internal carotid artery which is narrowed in caliber. Enhancing neoplasm is stable in size in comparison with the prior MRI. No evidence for acute stroke, new focus of abnormal enhancement, acute intracranial hemorrhage, or significant mass effect. Vascular: Diminished lumen of the right distal cavernous ICA. Skull and upper cervical spine: Stable right pterion craniotomy postsurgical changes, no discrete fluid collection in soft tissues. Otherwise bone marrow edema is normal. Sinuses/Orbits: Negative. Other: None. IMPRESSION: 1. Right-sided skullbase meningioma is stable in size from the prior MRI as described. The mass envelops the right pre  chiasmatic optic nerve and the right distal cavernous ICA. The flow void of right distal cavernous ICA is narrowed. 2. Interval resolution of brain parenchymal enhancement and fluid collections in the right middle cranial fossa surgical resection site. 3. No new acute intracranial abnormality identified. Electronically Signed   By: Kristine Garbe M.D.   On: 12/25/2016 14:53      IMPRESSION/PLAN: 1. 51 y.o. female status post partial resection of a right sphenoid grade I meningioma. We reviewed the findings and workup thus far with the patient. We reviewed her postoperative course and complications that developed from her disease. We reviewed  the rationale for treatment of her residual tumor. We also discussed the logistics and delivery and reviewed the results associated with conventional radiotherapy to the residual tumor and surgical resection cavity over 5 1/2 weeks. We discussed the risks, benefits, short, an dlong term effects of therapy and she is interested in moving forward. She plans to stay in [place] for treatment and will simulate today. Written consent is obtained and placed in the chart, a copy was provided to the patient. 2. Post-Op Seizure. The patient continues on Keppra and will follow up with Dr. Christella Noa for management of this. 3. 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 The patient has been seen by endocrinology in the past. I've contacted Dr. Lacy Duverney office to find out who she's been seen by in the past so that we can notify the provider of the involvement along the sella, which increases her risk of post treatment hypopituitarism. She is in agreement with this.    Carola Rhine, PAC    Tyler Pita, MD  [facility] Direct Dial: [phone]  Fax: [phone] [email or web address]  Skype  [email or web address]   This document serves as a record of  services personally performed by Tyler Pita, MD and Shona Simpson, PA-C. It was created on their behalf by Rae Lips, a trained medical scribe. The creation of this record is based on the scribe's personal observations and the providers' statements to them. This document has been checked and approved by the attending providers.

## 2016-12-30 NOTE — Progress Notes (Signed)
See progress note under physician encounter. 

## 2017-01-01 DIAGNOSIS — Z510 Encounter for antineoplastic radiation therapy: Secondary | ICD-10-CM | POA: Diagnosis not present

## 2017-01-06 ENCOUNTER — Ambulatory Visit
Admission: RE | Admit: 2017-01-06 | Discharge: 2017-01-06 | Disposition: A | Discharge status: Home / Self Care | Payer: BLUE CROSS/BLUE SHIELD | Source: Ambulatory Visit | Attending: Radiation Oncology | Admitting: Radiation Oncology

## 2017-01-06 ENCOUNTER — Other Ambulatory Visit: Payer: Self-pay | Admitting: Radiation Oncology

## 2017-01-06 DIAGNOSIS — D320 Benign neoplasm of cerebral meninges: Secondary | ICD-10-CM

## 2017-01-06 DIAGNOSIS — Z510 Encounter for antineoplastic radiation therapy: Secondary | ICD-10-CM | POA: Diagnosis not present

## 2017-01-07 ENCOUNTER — Ambulatory Visit
Admission: RE | Admit: 2017-01-07 | Discharge: 2017-01-07 | Disposition: A | Discharge status: Home / Self Care | Payer: BLUE CROSS/BLUE SHIELD | Source: Ambulatory Visit | Attending: Radiation Oncology | Admitting: Radiation Oncology

## 2017-01-07 ENCOUNTER — Telehealth: Payer: Self-pay | Admitting: *Deleted

## 2017-01-07 DIAGNOSIS — Z510 Encounter for antineoplastic radiation therapy: Secondary | ICD-10-CM | POA: Diagnosis not present

## 2017-01-07 NOTE — Telephone Encounter (Signed)
CALLED PATIENT TO GIVE INFO PER ALISON PERKINS, LVM FOR A RETURN CALL

## 2017-01-08 ENCOUNTER — Ambulatory Visit
Admission: RE | Admit: 2017-01-08 | Discharge: 2017-01-08 | Disposition: A | Discharge status: Home / Self Care | Payer: BLUE CROSS/BLUE SHIELD | Source: Ambulatory Visit | Attending: Radiation Oncology | Admitting: Radiation Oncology

## 2017-01-08 DIAGNOSIS — Z510 Encounter for antineoplastic radiation therapy: Secondary | ICD-10-CM | POA: Diagnosis not present

## 2017-01-09 ENCOUNTER — Ambulatory Visit
Admission: RE | Admit: 2017-01-09 | Discharge: 2017-01-09 | Disposition: A | Discharge status: Home / Self Care | Payer: BLUE CROSS/BLUE SHIELD | Source: Ambulatory Visit | Attending: Radiation Oncology | Admitting: Radiation Oncology

## 2017-01-09 DIAGNOSIS — Z510 Encounter for antineoplastic radiation therapy: Secondary | ICD-10-CM | POA: Diagnosis not present

## 2017-01-10 ENCOUNTER — Encounter: Payer: Self-pay | Admitting: Radiation Oncology

## 2017-01-10 ENCOUNTER — Ambulatory Visit
Admission: RE | Admit: 2017-01-10 | Discharge: 2017-01-10 | Disposition: A | Discharge status: Home / Self Care | Payer: BLUE CROSS/BLUE SHIELD | Source: Ambulatory Visit | Attending: Radiation Oncology | Admitting: Radiation Oncology

## 2017-01-10 DIAGNOSIS — Z510 Encounter for antineoplastic radiation therapy: Secondary | ICD-10-CM | POA: Diagnosis not present

## 2017-01-10 NOTE — Progress Notes (Signed)
Rec'd FMLA paperwork from patient to be filled out. Pt wishes to have paperwork returned to her when completed.

## 2017-01-14 ENCOUNTER — Ambulatory Visit
Admission: RE | Admit: 2017-01-14 | Discharge: 2017-01-14 | Disposition: A | Discharge status: Home / Self Care | Payer: BLUE CROSS/BLUE SHIELD | Source: Ambulatory Visit | Attending: Radiation Oncology | Admitting: Radiation Oncology

## 2017-01-14 DIAGNOSIS — Z510 Encounter for antineoplastic radiation therapy: Secondary | ICD-10-CM | POA: Diagnosis not present

## 2017-01-15 ENCOUNTER — Ambulatory Visit
Admission: RE | Admit: 2017-01-15 | Discharge: 2017-01-15 | Disposition: A | Discharge status: Home / Self Care | Payer: BLUE CROSS/BLUE SHIELD | Source: Ambulatory Visit | Attending: Radiation Oncology | Admitting: Radiation Oncology

## 2017-01-15 DIAGNOSIS — Z510 Encounter for antineoplastic radiation therapy: Secondary | ICD-10-CM | POA: Diagnosis not present

## 2017-01-16 ENCOUNTER — Ambulatory Visit: Payer: BLUE CROSS/BLUE SHIELD | Admitting: Radiation Oncology

## 2017-01-16 ENCOUNTER — Ambulatory Visit
Admission: RE | Admit: 2017-01-16 | Discharge: 2017-01-16 | Disposition: A | Discharge status: Home / Self Care | Payer: BLUE CROSS/BLUE SHIELD | Source: Ambulatory Visit | Attending: Radiation Oncology | Admitting: Radiation Oncology

## 2017-01-16 DIAGNOSIS — Z510 Encounter for antineoplastic radiation therapy: Secondary | ICD-10-CM | POA: Diagnosis not present

## 2017-01-17 ENCOUNTER — Ambulatory Visit
Admission: RE | Admit: 2017-01-17 | Discharge: 2017-01-17 | Disposition: A | Discharge status: Home / Self Care | Payer: BLUE CROSS/BLUE SHIELD | Source: Ambulatory Visit | Attending: Radiation Oncology | Admitting: Radiation Oncology

## 2017-01-17 DIAGNOSIS — Z510 Encounter for antineoplastic radiation therapy: Secondary | ICD-10-CM | POA: Diagnosis not present

## 2017-01-20 ENCOUNTER — Ambulatory Visit
Admission: RE | Admit: 2017-01-20 | Payer: BLUE CROSS/BLUE SHIELD | Source: Ambulatory Visit | Admitting: Radiation Oncology

## 2017-01-20 ENCOUNTER — Ambulatory Visit: Payer: BLUE CROSS/BLUE SHIELD | Admitting: Radiation Oncology

## 2017-01-20 ENCOUNTER — Ambulatory Visit
Admission: RE | Admit: 2017-01-20 | Discharge: 2017-01-20 | Disposition: A | Discharge status: Home / Self Care | Payer: BLUE CROSS/BLUE SHIELD | Source: Ambulatory Visit | Attending: Radiation Oncology | Admitting: Radiation Oncology

## 2017-01-20 DIAGNOSIS — Z510 Encounter for antineoplastic radiation therapy: Secondary | ICD-10-CM | POA: Diagnosis not present

## 2017-01-21 ENCOUNTER — Ambulatory Visit
Admission: RE | Admit: 2017-01-21 | Discharge: 2017-01-21 | Disposition: A | Discharge status: Home / Self Care | Payer: BLUE CROSS/BLUE SHIELD | Source: Ambulatory Visit | Attending: Radiation Oncology | Admitting: Radiation Oncology

## 2017-01-21 DIAGNOSIS — Z510 Encounter for antineoplastic radiation therapy: Secondary | ICD-10-CM | POA: Diagnosis not present

## 2017-01-22 ENCOUNTER — Ambulatory Visit
Admission: RE | Admit: 2017-01-22 | Discharge: 2017-01-22 | Disposition: A | Discharge status: Home / Self Care | Payer: BLUE CROSS/BLUE SHIELD | Source: Ambulatory Visit | Attending: Radiation Oncology | Admitting: Radiation Oncology

## 2017-01-22 ENCOUNTER — Ambulatory Visit: Payer: BLUE CROSS/BLUE SHIELD | Admitting: Radiation Oncology

## 2017-01-22 DIAGNOSIS — Z510 Encounter for antineoplastic radiation therapy: Secondary | ICD-10-CM | POA: Diagnosis not present

## 2017-01-23 ENCOUNTER — Ambulatory Visit
Admission: RE | Admit: 2017-01-23 | Discharge: 2017-01-23 | Disposition: A | Discharge status: Home / Self Care | Payer: BLUE CROSS/BLUE SHIELD | Source: Ambulatory Visit | Attending: Radiation Oncology | Admitting: Radiation Oncology

## 2017-01-23 DIAGNOSIS — Z510 Encounter for antineoplastic radiation therapy: Secondary | ICD-10-CM | POA: Diagnosis not present

## 2017-01-24 ENCOUNTER — Ambulatory Visit: Payer: BLUE CROSS/BLUE SHIELD

## 2017-01-24 ENCOUNTER — Ambulatory Visit: Payer: BLUE CROSS/BLUE SHIELD | Admitting: Radiation Oncology

## 2017-01-27 ENCOUNTER — Ambulatory Visit
Admission: RE | Admit: 2017-01-27 | Discharge: 2017-01-27 | Disposition: A | Discharge status: Home / Self Care | Payer: BLUE CROSS/BLUE SHIELD | Source: Ambulatory Visit | Attending: Radiation Oncology | Admitting: Radiation Oncology

## 2017-01-27 ENCOUNTER — Ambulatory Visit: Payer: BLUE CROSS/BLUE SHIELD | Admitting: Radiation Oncology

## 2017-01-27 DIAGNOSIS — Z510 Encounter for antineoplastic radiation therapy: Secondary | ICD-10-CM | POA: Diagnosis not present

## 2017-01-28 ENCOUNTER — Ambulatory Visit
Admission: RE | Admit: 2017-01-28 | Discharge: 2017-01-28 | Disposition: A | Discharge status: Home / Self Care | Payer: BLUE CROSS/BLUE SHIELD | Source: Ambulatory Visit | Attending: Radiation Oncology | Admitting: Radiation Oncology

## 2017-01-28 DIAGNOSIS — Z79899 Other long term (current) drug therapy: Secondary | ICD-10-CM | POA: Insufficient documentation

## 2017-01-28 DIAGNOSIS — Z510 Encounter for antineoplastic radiation therapy: Secondary | ICD-10-CM | POA: Diagnosis not present

## 2017-01-28 DIAGNOSIS — C719 Malignant neoplasm of brain, unspecified: Secondary | ICD-10-CM | POA: Diagnosis not present

## 2017-01-29 ENCOUNTER — Ambulatory Visit
Admission: RE | Admit: 2017-01-29 | Discharge: 2017-01-29 | Disposition: A | Discharge status: Home / Self Care | Payer: BLUE CROSS/BLUE SHIELD | Source: Ambulatory Visit | Attending: Radiation Oncology | Admitting: Radiation Oncology

## 2017-01-29 DIAGNOSIS — Z510 Encounter for antineoplastic radiation therapy: Secondary | ICD-10-CM | POA: Diagnosis not present

## 2017-01-30 ENCOUNTER — Ambulatory Visit
Admission: RE | Admit: 2017-01-30 | Discharge: 2017-01-30 | Disposition: A | Discharge status: Home / Self Care | Payer: BLUE CROSS/BLUE SHIELD | Source: Ambulatory Visit | Attending: Radiation Oncology | Admitting: Radiation Oncology

## 2017-01-30 DIAGNOSIS — Z510 Encounter for antineoplastic radiation therapy: Secondary | ICD-10-CM | POA: Diagnosis not present

## 2017-01-31 ENCOUNTER — Ambulatory Visit
Admission: RE | Admit: 2017-01-31 | Discharge: 2017-01-31 | Disposition: A | Discharge status: Home / Self Care | Payer: BLUE CROSS/BLUE SHIELD | Source: Ambulatory Visit | Attending: Radiation Oncology | Admitting: Radiation Oncology

## 2017-01-31 DIAGNOSIS — Z510 Encounter for antineoplastic radiation therapy: Secondary | ICD-10-CM | POA: Diagnosis not present

## 2017-02-03 ENCOUNTER — Ambulatory Visit
Admission: RE | Admit: 2017-02-03 | Discharge: 2017-02-03 | Disposition: A | Discharge status: Home / Self Care | Payer: BLUE CROSS/BLUE SHIELD | Source: Ambulatory Visit | Attending: Radiation Oncology | Admitting: Radiation Oncology

## 2017-02-03 DIAGNOSIS — Z510 Encounter for antineoplastic radiation therapy: Secondary | ICD-10-CM | POA: Diagnosis not present

## 2017-02-04 ENCOUNTER — Ambulatory Visit
Admission: RE | Admit: 2017-02-04 | Discharge: 2017-02-04 | Disposition: A | Discharge status: Home / Self Care | Payer: BLUE CROSS/BLUE SHIELD | Source: Ambulatory Visit | Attending: Radiation Oncology | Admitting: Radiation Oncology

## 2017-02-04 DIAGNOSIS — Z510 Encounter for antineoplastic radiation therapy: Secondary | ICD-10-CM | POA: Diagnosis not present

## 2017-02-05 ENCOUNTER — Ambulatory Visit
Admission: RE | Admit: 2017-02-05 | Discharge: 2017-02-05 | Disposition: A | Discharge status: Home / Self Care | Payer: BLUE CROSS/BLUE SHIELD | Source: Ambulatory Visit | Attending: Radiation Oncology | Admitting: Radiation Oncology

## 2017-02-05 DIAGNOSIS — Z510 Encounter for antineoplastic radiation therapy: Secondary | ICD-10-CM | POA: Diagnosis not present

## 2017-02-06 ENCOUNTER — Ambulatory Visit
Admission: RE | Admit: 2017-02-06 | Discharge: 2017-02-06 | Disposition: A | Discharge status: Home / Self Care | Payer: BLUE CROSS/BLUE SHIELD | Source: Ambulatory Visit | Attending: Radiation Oncology | Admitting: Radiation Oncology

## 2017-02-06 DIAGNOSIS — Z510 Encounter for antineoplastic radiation therapy: Secondary | ICD-10-CM | POA: Diagnosis not present

## 2017-02-07 ENCOUNTER — Ambulatory Visit: Payer: BLUE CROSS/BLUE SHIELD

## 2017-02-10 ENCOUNTER — Ambulatory Visit
Admission: RE | Admit: 2017-02-10 | Discharge: 2017-02-10 | Disposition: A | Discharge status: Home / Self Care | Payer: BLUE CROSS/BLUE SHIELD | Source: Ambulatory Visit | Attending: Radiation Oncology | Admitting: Radiation Oncology

## 2017-02-10 DIAGNOSIS — Z510 Encounter for antineoplastic radiation therapy: Secondary | ICD-10-CM | POA: Diagnosis not present

## 2017-02-11 ENCOUNTER — Ambulatory Visit
Admission: RE | Admit: 2017-02-11 | Discharge: 2017-02-11 | Disposition: A | Discharge status: Home / Self Care | Payer: BLUE CROSS/BLUE SHIELD | Source: Ambulatory Visit | Attending: Radiation Oncology | Admitting: Radiation Oncology

## 2017-02-11 DIAGNOSIS — Z510 Encounter for antineoplastic radiation therapy: Secondary | ICD-10-CM | POA: Diagnosis not present

## 2017-02-12 ENCOUNTER — Ambulatory Visit
Admission: RE | Admit: 2017-02-12 | Discharge: 2017-02-12 | Disposition: A | Discharge status: Home / Self Care | Payer: BLUE CROSS/BLUE SHIELD | Source: Ambulatory Visit | Attending: Radiation Oncology | Admitting: Radiation Oncology

## 2017-02-12 DIAGNOSIS — Z510 Encounter for antineoplastic radiation therapy: Secondary | ICD-10-CM | POA: Diagnosis not present

## 2017-02-13 ENCOUNTER — Ambulatory Visit: Payer: BLUE CROSS/BLUE SHIELD

## 2017-02-13 ENCOUNTER — Ambulatory Visit
Admission: RE | Admit: 2017-02-13 | Discharge: 2017-02-13 | Disposition: A | Discharge status: Home / Self Care | Payer: BLUE CROSS/BLUE SHIELD | Source: Ambulatory Visit | Attending: Radiation Oncology | Admitting: Radiation Oncology

## 2017-02-13 DIAGNOSIS — Z510 Encounter for antineoplastic radiation therapy: Secondary | ICD-10-CM | POA: Diagnosis not present

## 2017-02-14 ENCOUNTER — Ambulatory Visit: Payer: BLUE CROSS/BLUE SHIELD

## 2017-02-14 ENCOUNTER — Ambulatory Visit
Admission: RE | Admit: 2017-02-14 | Discharge: 2017-02-14 | Disposition: A | Discharge status: Home / Self Care | Payer: BLUE CROSS/BLUE SHIELD | Source: Ambulatory Visit | Attending: Radiation Oncology | Admitting: Radiation Oncology

## 2017-02-14 DIAGNOSIS — Z510 Encounter for antineoplastic radiation therapy: Secondary | ICD-10-CM | POA: Diagnosis not present

## 2017-02-17 ENCOUNTER — Telehealth: Payer: Self-pay | Admitting: Radiation Oncology

## 2017-02-17 ENCOUNTER — Encounter: Payer: Self-pay | Admitting: Radiation Oncology

## 2017-02-17 ENCOUNTER — Ambulatory Visit
Admission: RE | Admit: 2017-02-17 | Discharge: 2017-02-17 | Disposition: A | Discharge status: Home / Self Care | Payer: BLUE CROSS/BLUE SHIELD | Source: Ambulatory Visit | Attending: Radiation Oncology | Admitting: Radiation Oncology

## 2017-02-17 DIAGNOSIS — Z510 Encounter for antineoplastic radiation therapy: Secondary | ICD-10-CM | POA: Diagnosis not present

## 2017-02-17 NOTE — Telephone Encounter (Signed)
Faxed records to [facility] at Dr. Owens Loffler office. Penny at Dr. Almetta Lovely office reports they will phone the patient with her appointment. Fax confirmation of delivery of office notes received. Provided patient with a copy of her medical records to take to the appointment with Dr. Chalmers Cater. Also, provided patient with a note to return to work on 02/24/17.

## 2017-02-18 ENCOUNTER — Telehealth: Payer: Self-pay | Admitting: Radiation Oncology

## 2017-02-18 NOTE — Telephone Encounter (Signed)
Received fax from Dr. Almetta Lovely office explaining this patient has an appointment on 03/13/17 at 1000. Phoned patient to ensure she is aware of this appointment. She was not. Informed her of appointment date and time. Went on to provide address and phone number. Patient verbalized understanding and expressed appreciation for the call.

## 2017-02-19 NOTE — Progress Notes (Signed)
  Radiation Oncology         (336) [phone] ________________________________  Name: Quincey Nored MRN: [id number]  Date: 02/17/2017  DOB: [birth year]  End of Treatment Note  Diagnosis:   51 yo woman s/p partial resection of a right sphenoid grade I meningioma     Indication for treatment:  Palliative       Radiation treatment dates:   01/06/2017 to 02/17/2017  Site/dose:   The target was treated to 50.4 Gy in 28 fractions of 1.8 Gy.  Beams/energy:   VMAT // 6X  Narrative: The patient tolerated radiation treatment relatively well.   She reported headaches and nausea have resolved. She denied dizziness, diplopia or blurred vision in left eye. She did experience severe fatigue.   Plan: The patient has completed radiation treatment. The patient will return to radiation oncology clinic for routine followup in one month. I advised her to call or return sooner if she has any questions or concerns related to her recovery or treatment. ________________________________  Sheral Apley. Tammi Klippel, M.D.   This document serves as a record of services personally performed by Tyler Pita, MD. It was created on his behalf by Arlyce Harman, a trained medical scribe. The creation of this record is based on the scribe's personal observations and the provider's statements to them. This document has been checked and approved by the attending provider.

## 2017-03-25 ENCOUNTER — Ambulatory Visit
Admission: RE | Admit: 2017-03-25 | Discharge: 2017-03-25 | Disposition: A | Discharge status: Home / Self Care | Payer: BLUE CROSS/BLUE SHIELD | Source: Ambulatory Visit | Attending: Urology | Admitting: Urology

## 2017-03-25 ENCOUNTER — Encounter: Payer: Self-pay | Admitting: Urology

## 2017-03-25 ENCOUNTER — Other Ambulatory Visit: Payer: Self-pay

## 2017-03-25 VITALS — BP 140/102 | HR 77 | Temp 97.9°F | Resp 18 | Ht 70.0 in | Wt 197.4 lb

## 2017-03-25 DIAGNOSIS — D320 Benign neoplasm of cerebral meninges: Secondary | ICD-10-CM

## 2017-03-25 DIAGNOSIS — Z510 Encounter for antineoplastic radiation therapy: Secondary | ICD-10-CM | POA: Diagnosis not present

## 2017-03-25 NOTE — Progress Notes (Signed)
Radiation Oncology         (336) [phone] ________________________________  Name: Luane Rochon MRN: [id number]  Date: 03/25/2017  DOB: Sep 13, 1965  Post Treatment Note  CC: Ocie Bob, FNP  Ashok Pall, MD  Diagnosis:   51 yo woman s/p partial resection of a right sphenoid grade I meningioma     Interval Since Last Radiation:  5 weeks   01/06/2017 to 02/17/2017:  The target was treated to 50.4 Gy in 28 fractions of 1.8 Gy post partial resection.  Narrative:  The patient returns today for routine follow-up. She tolerated radiation treatment relatively well.   She experienced headaches and nausea which resolved prior to completion of treatment. She denied dizziness, diplopia or blurred vision in left eye. She did experience severe fatigue.   On review of systems, the patient states that she is doing well overall. She has resumed working. She continues with generalized fatigue, particularly at the end of the day but this is not interfering with her routine daily activities and she feels like this is gradually improving. She also continues with decreased appetite but reports that she is eating 3 meals a day to maintain her weight and strength. She is followed with Dr. Chalmers Cater, in endocrinology and reports that her most recent labs were all within normal limits. She has a follow-up appointment with Dr. Chalmers Cater in May 2019. She denies headaches, dizziness, changes in her auditory or visual acuity, tinnitus, imbalance or seizure activity. She denies recent fever, chills or night sweats. She has residual paresthesias in her right forehead and cheek but feels that the sensation is gradually returning. She denies any difficulty with speech, swallowing or chewing.  ALLERGIES:  is allergic to no known allergies and other.  Meds: Current Outpatient Medications  Medication Sig Dispense Refill  . benazepril (LOTENSIN) 10 MG tablet Take 10 mg by mouth daily.    . cabergoline (DOSTINEX) 0.5 MG tablet  Take 0.5 mg by mouth 2 (two) times a week.    . Cholecalciferol (VITAMIN D PO) Take 1 capsule by mouth daily.    . Cyanocobalamin (VITAMIN B 12 PO) Take 1 tablet by mouth daily.    Marland Kitchen FOLIC ACID PO Take 1 tablet by mouth daily.    Marland Kitchen levETIRAcetam (KEPPRA) 500 MG tablet Take 1 tablet (500 mg total) by mouth 2 (two) times daily. (Patient taking differently: Take 500 mg by mouth 3 (three) times daily. ) 60 tablet 0  . Multiple Vitamin (MULITIVITAMIN WITH MINERALS) TABS Take 1 tablet by mouth daily.     . pravastatin (PRAVACHOL) 20 MG tablet Take 20 mg by mouth daily.    . sitaGLIPtin (JANUVIA) 100 MG tablet Take 100 mg by mouth daily.     No current facility-administered medications for this encounter.     Physical Findings:  height is 5\' 10"  (1.778 m) and weight is 197 lb 6.4 oz (89.5 kg). Her oral temperature is 97.9 F (36.6 C). Her blood pressure is 140/102 (abnormal) and her pulse is 77. Her respiration is 18 and oxygen saturation is 100%.  Pain Assessment Pain Score: 0-No pain/10 In general this is a well appearing african [place] female in no acute distress. She's alert and oriented x4 and appropriate throughout the examination. Cardiopulmonary assessment is negative for acute distress and she exhibits normal effort. She appears grossly neurologically intact. The right medial eyelid remains sutured closed with an approximately 5-6 mm opening. There is decreased sensation to light touch in the right forehead and right  cheek but normal sensation in the right chin as well as on the left side of the face.  Sternocleidomastoid strength is 5 out of 5 and equal bilaterally.  Lab Findings: Lab Results  Component Value Date   WBC 10.3 10/03/2016   HGB 12.8 10/03/2016   HCT 39.9 10/03/2016   MCV 86.4 10/03/2016   PLT 336 10/03/2016     Radiographic Findings: No results found.  Impression/Plan: 44. 51 yo woman s/p partial resection of a right sphenoid grade I meningioma.  She has recovered  well from the effects of radiotherapy. We will obtain an MRI brain at 3 months post-treatment to assess her response to treatment and will coordinate this with an upcoming brain/spine [place] for presentation and discussion prior to her office follow up. She knows to call with any questions or concerns in the interim. She is comfortable with this plan. 2. Decreased appetite.  Discussed that this can be normal following treatment and will likely improve with time. We will continue to monitor this closely and follow-up visits. She is maintaining her weight and eating 3 meals per day despite the lack of appetite.     Nicholos Johns, PA-C

## 2017-03-25 NOTE — Addendum Note (Signed)
Encounter addended by: Malena Edman, RN on: 03/25/2017 4:08 PM  Actions taken: Charge Capture section accepted

## 2017-05-07 ENCOUNTER — Other Ambulatory Visit: Payer: Self-pay | Admitting: Radiation Therapy

## 2017-05-07 DIAGNOSIS — D329 Benign neoplasm of meninges, unspecified: Secondary | ICD-10-CM

## 2017-05-20 NOTE — Progress Notes (Signed)
[name] 52 y.o. woman with right sphenoid grade I meningioma completed radiation 02-17-17, review 05-22-17 MRI brain w wo contrast, FU.    Headache:No Pain: No Dizziness:No Nausea/vomiting:No Ringing in ears:No, reports right ear clear drainage the drainage becomes a crust on the right ear ocurring over the last two weeks.  Reported the medical change to Dr Christella Noa. Visual changes (Blurred/ diplopia double vision,blind spots, and peripheral vsion changes):No, readers  Fatigue:No Cognitive changes: Alert and oriented x 3 with fluent speech,able to complete sentences without difficulty with word finding or organization of sentences. Weight: Wt Readings from Last 3 Encounters:  05/29/17 189 lb 9.6 oz (86 kg)  03/25/17 197 lb 6.4 oz (89.5 kg)  12/30/16 202 lb 3.2 oz (91.7 kg)  BP (!) 149/88 (BP Location: Right Arm, Patient Position: Sitting, Cuff Size: Normal)   Pulse 66   Temp 97.8 F (36.6 C) (Oral)   Resp 18   Ht 5\' 10"  (1.778 m)   Wt 189 lb 9.6 oz (86 kg)   LMP 02/22/2017   SpO2 100%   BMI 27.20 kg/m

## 2017-05-21 ENCOUNTER — Other Ambulatory Visit (HOSPITAL_COMMUNITY): Payer: Self-pay | Admitting: Neurosurgery

## 2017-05-21 DIAGNOSIS — D329 Benign neoplasm of meninges, unspecified: Secondary | ICD-10-CM

## 2017-05-22 ENCOUNTER — Other Ambulatory Visit: Payer: Self-pay | Admitting: Neurosurgery

## 2017-05-22 ENCOUNTER — Ambulatory Visit
Admission: RE | Admit: 2017-05-22 | Discharge: 2017-05-22 | Disposition: A | Discharge status: Home / Self Care | Payer: BLUE CROSS/BLUE SHIELD | Source: Ambulatory Visit | Attending: Neurosurgery | Admitting: Neurosurgery

## 2017-05-22 ENCOUNTER — Ambulatory Visit
Admission: RE | Admit: 2017-05-22 | Discharge: 2017-05-22 | Disposition: A | Discharge status: Home / Self Care | Payer: BLUE CROSS/BLUE SHIELD | Source: Ambulatory Visit | Attending: Radiation Oncology | Admitting: Radiation Oncology

## 2017-05-22 DIAGNOSIS — D329 Benign neoplasm of meninges, unspecified: Secondary | ICD-10-CM

## 2017-05-22 MED ORDER — GADOBENATE DIMEGLUMINE 529 MG/ML IV SOLN
17.0000 mL | Freq: Once | INTRAVENOUS | Status: AC | PRN
  Administered 2017-05-22: 17 mL via INTRAVENOUS

## 2017-05-26 ENCOUNTER — Encounter (HOSPITAL_COMMUNITY): Payer: Self-pay

## 2017-05-26 ENCOUNTER — Ambulatory Visit (HOSPITAL_COMMUNITY): Payer: BLUE CROSS/BLUE SHIELD

## 2017-05-29 ENCOUNTER — Inpatient Hospital Stay: Payer: BLUE CROSS/BLUE SHIELD | Attending: Internal Medicine | Admitting: Internal Medicine

## 2017-05-29 ENCOUNTER — Telehealth: Payer: Self-pay | Admitting: Internal Medicine

## 2017-05-29 ENCOUNTER — Encounter: Payer: Self-pay | Admitting: Urology

## 2017-05-29 ENCOUNTER — Ambulatory Visit
Admission: RE | Admit: 2017-05-29 | Discharge: 2017-05-29 | Disposition: A | Discharge status: Home / Self Care | Payer: BLUE CROSS/BLUE SHIELD | Source: Ambulatory Visit | Attending: Radiation Oncology | Admitting: Radiation Oncology

## 2017-05-29 ENCOUNTER — Other Ambulatory Visit: Payer: Self-pay

## 2017-05-29 VITALS — BP 149/88 | HR 66 | Temp 97.8°F | Resp 18 | Ht 70.0 in | Wt 189.6 lb

## 2017-05-29 VITALS — BP 144/74 | HR 61 | Temp 98.5°F | Resp 17 | Wt 189.3 lb

## 2017-05-29 DIAGNOSIS — Z9889 Other specified postprocedural states: Secondary | ICD-10-CM | POA: Insufficient documentation

## 2017-05-29 DIAGNOSIS — Z79899 Other long term (current) drug therapy: Secondary | ICD-10-CM | POA: Diagnosis not present

## 2017-05-29 DIAGNOSIS — T66XXXA Radiation sickness, unspecified, initial encounter: Secondary | ICD-10-CM | POA: Diagnosis not present

## 2017-05-29 DIAGNOSIS — D329 Benign neoplasm of meninges, unspecified: Secondary | ICD-10-CM

## 2017-05-29 DIAGNOSIS — H7090 Unspecified mastoiditis, unspecified ear: Secondary | ICD-10-CM | POA: Diagnosis not present

## 2017-05-29 DIAGNOSIS — Y842 Radiological procedure and radiotherapy as the cause of abnormal reaction of the patient, or of later complication, without mention of misadventure at the time of the procedure: Secondary | ICD-10-CM | POA: Diagnosis not present

## 2017-05-29 DIAGNOSIS — D320 Benign neoplasm of cerebral meninges: Secondary | ICD-10-CM | POA: Insufficient documentation

## 2017-05-29 NOTE — Progress Notes (Signed)
Radiation Oncology         (336) [phone] ________________________________  Name: [name] MRN: [id number]  Date: 05/29/2017  DOB: 17-Apr-1966  Post Treatment Note  CC: Ocie Bob, FNP  Ashok Pall, MD  Diagnosis:   52 yo woman s/p partial resection of a right sphenoid grade I meningioma     Interval Since Last Radiation:  3 months   01/06/2017 to 02/17/2017:  The target was treated to 50.4 Gy in 28 fractions of 1.8 Gy post partial resection.  Narrative:  The patient returns today for routine follow-up and to review recent post-treatment brain MRI. She has recovered well from the effects of radiotherapy.  She has developed drainage of clear fluid from the right ear, persistent over the past week.  She recently had a CT head and MRI brain for further evaluation and presents today to review findings. The CT suggests suspected dehiscent RIGHT superior temporal bone just anterior to the tegmen tympani, correlating with T2 bright fluid on MR with concern for osteonecrosis of the temporal bone post XRT.  However, Dr. Tammi Klippel has personally reviewed the CT images and does not feel that there is convincing evidence of osteoradionecrosis but much more likely that the fluid drainage from the right ear is coming from some radiation induced mastoiditis which is evidenced on the CT and MRI scans. On MRI, overall the tumor is similar in volume to the prior study. The tumor extends into the sella and suprasellar cistern, unchanged and extends into the prepontine cistern unchanged. There is encasement of the cisternal segment of the trigeminal nerve on the right. The tumor overlies the foramina ovale with denervation atrophic changes in the muscles of mastication on the right similar to the prior study and encasement of the right cavernous sinus again noted which remains patent.  On review of systems, the patient states that she is doing well overall. She has had recent clear drainage from her right ear  over the past week.  She reports a feeling of pressure, "like my ears needed to pop" preceding the drainage.  She denies any other associated symptoms, specifically denies ear pain, yellow or bloody drainage from the ear, fever or chills. She has residual paresthesias in her right forehead and cheek. She denies any difficulty with speech, swallowing or chewing. She has resumed working. She continues with mild generalized fatigue, particularly at the end of the day but this is not interfering with her routine daily activities. She also continues with decreased appetite but reports that she is eating 3 meals a day to maintain her weight and strength. She is followed with Dr. Chalmers Cater, in endocrinology and reports that her most recent labs were all within normal limits. She has a follow-up appointment with Dr. Chalmers Cater in May 2019. She denies headaches, dizziness, changes in her auditory or visual acuity, tinnitus, imbalance or seizure activity. She denies recent fever, chills or night sweats.   ALLERGIES:  is allergic to no known allergies and other.  Meds: Current Outpatient Medications  Medication Sig Dispense Refill  . benazepril (LOTENSIN) 10 MG tablet Take 10 mg by mouth daily.    . Cholecalciferol (VITAMIN D PO) Take 1 capsule by mouth daily.    . Cyanocobalamin (VITAMIN B 12 PO) Take 1 tablet by mouth daily.    Marland Kitchen FOLIC ACID PO Take 1 tablet by mouth daily.    . Multiple Vitamin (MULITIVITAMIN WITH MINERALS) TABS Take 1 tablet by mouth daily.     . pravastatin (PRAVACHOL)  20 MG tablet Take 20 mg by mouth daily.    . sitaGLIPtin (JANUVIA) 100 MG tablet Take 100 mg by mouth daily.     No current facility-administered medications for this encounter.     Physical Findings:  height is 5\' 10"  (1.778 m) and weight is 189 lb 9.6 oz (86 kg). Her oral temperature is 97.8 F (36.6 C). Her blood pressure is 149/88 (abnormal) and her pulse is 66. Her respiration is 18 and oxygen saturation is 100%.  Pain  Assessment Pain Score: 0-No pain/10 In general this is a well appearing african [place] female in no acute distress. She's alert and oriented x4 and appropriate throughout the examination. Cardiopulmonary assessment is negative for acute distress and she exhibits normal effort. Bilateral TMs remain intact without erythema or edema.  There is no evidence of TM perforation on the right and no purulent drainage seen.  She appears grossly neurologically intact. The right medial eyelid remains sutured closed to allow healing from an injury she sustained from a foreign object.  There is an approximately 5-6 mm opening. There is decreased sensation to light touch in the right forehead and right cheek but normal sensation in the right chin as well as on the left side of the face.  Sternocleidomastoid strength is 5 out of 5 and equal bilaterally.  Lab Findings: Lab Results  Component Value Date   WBC 10.3 10/03/2016   HGB 12.8 10/03/2016   HCT 39.9 10/03/2016   MCV 86.4 10/03/2016   PLT 336 10/03/2016     Radiographic Findings: Ct Head Wo Contrast  Result Date: 05/22/2017 CLINICAL DATA:  History of brain meningioma resection, 09/2016. Drainage from RIGHT ear. EXAM: CT HEAD WITHOUT CONTRAST TECHNIQUE: Contiguous axial images were obtained from the base of the skull through the vertex without intravenous contrast. COMPARISON:  MR brain 05/22/2017. FINDINGS: Brain: No evidence for acute stroke, acute hemorrhage, hydrocephalus, or extra-axial fluid. Partial RIGHT temporal lobectomy. Residual sellar tumor extends to both cavernous sinuses, greater on RIGHT. Vascular: No hyperdense vessel or unexpected calcification. Skull: RIGHT pterional craniotomy/craniectomy appears uncomplicated. Study was not performed as a dedicated temporal bone study, but coronal reformatted imaging through the skull base demonstrates dehiscence and fragmentary change along the floor of the middle cranial fossa, just anterior to the  tegmen tympani. This lies along the anterior margin of the petrous apex. See for instance image 163 series 5. This correlates with fluid in the petrous apex air cells on coronal T2 MR; see for instance image 27 series 9. 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 Sinuses/Orbits: No significant sinus opacity or layering fluid. Negative orbits. Other: None. 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 Recommend dedicated CT temporal bone with contrast for further evaluation. No acute intracranial findings.  Postsurgical changes as described. Electronically Signed   By: Staci Righter M.D.   On: 05/22/2017 10:52   Mr [name] PJ Contrast  Result Date: 05/22/2017 CLINICAL DATA:  Meningioma. Craniotomy 09/25/2016 for resection. Radiation treatment completed. Creatinine was obtained on site at [facility] at [address] Results: Creatinine 1.0 mg/dL. EXAM: MRI HEAD WITHOUT AND WITH CONTRAST TECHNIQUE: Multiplanar, multiecho pulse sequences of the brain and surrounding structures were obtained without and with intravenous contrast. CONTRAST:  77mL MULTIHANCE GADOBENATE DIMEGLUMINE 529 MG/ML IV SOLN COMPARISON:  MRI 12/25/2016, 10/17/2016 FINDINGS: Brain: Right pterional  craniotomy for resection of cavernous sinus meningioma on the right. Right cavernous sinus enhancing tumor is unchanged in size and volume. Tumor extends into the sella which is unchanged. Suprasellar tumor with flattening of the optic chiasm and right optic nerve unchanged. There is encasement of the right internal carotid artery which remains patent. Tumor extends into the prepontine cistern on the right, similar in volume. There is encasement of the cisternal segment of the trigeminal nerve as well as invasion of the trigeminal ganglion. Tumor extends along the tentorium on  the right which is unchanged. Postop encephalomalacia right inferior temporal lobe. Negative for acute infarct. Negative for hydrocephalus. No midline shift. No other enhancing mass lesion identified in the brain. Vascular: Normal flow voids at the base the brain. Tumor encasement of the right cavernous carotid. Skull and upper cervical spine: No acute skull abnormality. Right pterional craniotomy. Sinuses/Orbits: Mild mucosal edema in the paranasal sinuses. Normal orbit Other: Abnormal signal in the muscles of mastication on the right with some atrophy and hyperintensity on T2. This is due to denervation related to V3 involvement. Tumoral overlies the foramen ovale. IMPRESSION: Postop resection and radiation for trans spatial cavernous meningioma on the right. Overall tumor is similar in volume to the prior study. Tumor extends into the sella and suprasellar cistern, unchanged. Tumor extends into the prepontine cistern unchanged. There encasement of the cisternal segment of the trigeminal nerve on the right. Tumor overlies the foramina ovale with denervation atrophic changes in the muscles of mastication on the right similar to the prior study. Encasement of the right cavernous sinus again noted which remains patent. Electronically Signed   By: Franchot Gallo M.D.   On: 05/22/2017 10:25    Impression/Plan: 1. 52 yo woman s/p partial resection of a right sphenoid grade I meningioma.  She has recovered well from the effects of radiotherapy. She is scheduled for surgery with Dr. Christella Noa to repair the postoperative right temporal deformity.  She is scheduled to meet Dr. Mickeal Skinner this morning following our visit and he will take over from here and continue to monitor her closely with serial MRI brain scans to be coordinated with an upcoming brain/spine [place] for presentation and discussion prior to her office follow up.  We will follow her progress via discussion at brain conference and are happy to see her back on an  as needed basis .  We would be happy to continue to participate in her care if indicated in the future. She knows to call with any questions or concerns related to previous radiotherapy. She is comfortable with this plan. 2. Fluid drainage from right ear.  Dr. Tammi Klippel thoroughly reviewed her imaging studies with her today in the office  And explained that the most likely cause of this clear fluid drainage from her right ear is related to some radiation induced mastoiditis which will resolve spontaneously with time.  At this point, there is no clear-cut evidence for radiation induced osteonecrosis but we will continue to follow her subsequent imaging closely.    Nicholos Johns, PA-C    Tyler Pita, MD  [facility] Direct Dial: [phone]  Fax: [phone] [email or web address]  Skype  LinkedIn

## 2017-05-29 NOTE — Telephone Encounter (Signed)
Scheduled appt per 11/7 los - Gave patient AVS and calender per los.  

## 2017-05-29 NOTE — Progress Notes (Signed)
[name] [place], [place] [address] [phone]   New Patient Evaluation  Date of Service: 05/29/17 Patient Name: [name] Patient MRN: [id number] Patient DOB: 10/28/1965 Provider: Ventura Sellers, MD  Identifying Statement:  [name] is a 52 y.o. female with skull base meningioma who presents for initial consultation and evaluation.    Referring Provider: Ocie Bob, [place] [address]  Oncologic History: 08/30/09: Workup for hyperprolactinemia lead to MRI brain, demonstrates likely meningioma in skull base and cavernous sinus 09/25/16: Clinical and radiographic progression prompt craniotomy, debulking resection by Dr. Christella Noa.  Path WHO grade I meningioma 02/17/17: Completes IMRT with Dr. Tammi Klippel, 54 Gy in 30 fractions  History of Present Illness: The patient's records from the referring physician were obtained and reviewed and the patient interviewed to confirm this HPI.  [name] presented to medical attention in 2011, after elevated prolactin level was discovered, leading to demonstration of a skull base mass on brain MRI.  This lesion was followed serially with imaging, until this past year when she began experiencing bulging of the right eye, impaired vision in the right eye, and numbness in the right half of her face.  This prompted an MRI which demonstrated progression, which was treated with sub-total debulking resectoin with Dr. Christella Noa.  This was followed by fractionated radiotherapy with Dr. Tammi Klippel, which was completed on 02/17/17.  She presents today to review her pathology as well as follow up MRI after radiation.  She continues to have sensory loss in the right side of her face.  She suffered a corneal abrasion from a foreign object (due to sensory impairment), and unfortunately now has her right eye sutured closed to allow for healing.  Otherwise she denies new or  progressive neurologic deficits.     Medications: Current Outpatient Medications on File Prior to Visit  Medication Sig Dispense Refill  . benazepril (LOTENSIN) 10 MG tablet Take 10 mg by mouth daily.    . Cholecalciferol (VITAMIN D PO) Take 1 capsule by mouth daily.    . Cyanocobalamin (VITAMIN B 12 PO) Take 1 tablet by mouth daily.    Marland Kitchen FOLIC ACID PO Take 1 tablet by mouth daily.    . Multiple Vitamin (MULITIVITAMIN WITH MINERALS) TABS Take 1 tablet by mouth daily.     . pravastatin (PRAVACHOL) 20 MG tablet Take 20 mg by mouth daily.    . sitaGLIPtin (JANUVIA) 100 MG tablet Take 100 mg by mouth daily.     No current facility-administered medications on file prior to visit.     Allergies:  Allergies  Allergen Reactions  . No Known Allergies   . Other     All Meat products, pt is strictly a vegetarian    Past Medical History:  Past Medical History:  Diagnosis Date  . 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 Diabetes mellitus without complication ([facility])   . Dysrhythmia    PT STATES SHE HAS AN EXTRA HEART BEAT AT TIMES--FIRST NOTICED BY HER GYN  DR. Cletis Media COUPLE OF YRS AGO--PT STATES SHE WAS SENT FOR STRESS TEST-TREADMILL.  -PT DOESN'T HAVE ANY OTHER INFORMATION.  Marland Kitchen Headache   . Hypertension    B/P ELEVATED LAST 3 DOCTOR VISITS--BUT NO PRIOR HX AND NOT ON B/P MEDS  . Meningioma ([facility])    HX OF ELEVATED PROLACTIN LEVELS-FOUND TO HAVE MENINGIOMA ADJACENT TO  PITITUARY GLAND--NO OTHER PROBLEMS ASSOC WITH THE TUMOR--NO SURGERY NEEDED--PT SEES DR. [name] CABELL IN [place] ONCE A YEAR   Past Surgical History:  Past Surgical History:  Procedure Laterality Date  . CESAREAN SECTION  06/1990  . CRANIOTOMY Right 09/25/2016   Procedure: PTERIONAL CRANIOTOMY  FOR TUMOR;  Surgeon: Ashok Pall, MD;  Location: [facility];  Service: Neurosurgery;  Laterality: Right;  . CYST REMOVED BOTH HANDS  1997 OR 1998  . PARTIAL KNEE ARTHROPLASTY  06/24/2011   Procedure:  UNICOMPARTMENTAL KNEE;  Surgeon: Mauri Pole, MD;  Location: [facility];  Service: Orthopedics;  Laterality: Right;  . RIGHT KNEE ARTHROSCOPY  1984   Social History:  Social History   Socioeconomic History  . Marital status: Single    Spouse name: Not on file  . Number of children: Not on file  . Years of education: Not on file  . Highest education level: Not on file  Social Needs  . Financial resource strain: Not on file  . Food insecurity - worry: Not on file  . Food insecurity - inability: Not on file  . Transportation needs - medical: Not on file  . Transportation needs - non-medical: Not on file  Occupational History  . Not on file  Tobacco Use  . Smoking status: Former Smoker    Packs/day: 0.25    Years: 4.00    Pack years: 1.00    Types: Cigarettes    Last attempt to quit: 09/18/2010    Years since quitting: 6.6  . Smokeless tobacco: Never Used  . Tobacco comment: QUIT 3 YEARS AGO-ABOUT 2008--SOCIAL SMOKER ONLY  Substance and Sexual Activity  . Alcohol use: No  . Drug use: No  . Sexual activity: Not Currently  Other Topics Concern  . Not on file  Social History Narrative  . Not on file   Family History: No family history on file.  Review of Systems: Constitutional: Denies fevers, chills or abnormal weight loss Eyes: Per HPI Ears, nose, mouth, throat, and face: Denies mucositis or sore throat Respiratory: Denies cough, dyspnea or wheezes Cardiovascular: Denies palpitation, chest discomfort or lower extremity swelling Gastrointestinal:  Denies nausea, constipation, diarrhea GU: Denies dysuria or incontinence Skin: Denies abnormal skin rashes Neurological: Per HPI Musculoskeletal: Denies joint pain, back or neck discomfort. No decrease in ROM Behavioral/Psych: Denies anxiety, disturbance in thought content, and mood instability  Physical Exam: Vitals:   05/29/17 1053  BP: (!) 144/74  Pulse: 61  Resp: 17  Temp: 98.5 F (36.9 C)  SpO2: 100%   KPS:  80. General: Alert, cooperative, pleasant, in no acute distress Head: Craniotomy scar noted, dry and intact. EENT: Right eye proptotic, sutured closed Lungs: Resp effort normal Cardiac: Regular rate and rhythm Abdomen: Soft, non-distended abdomen Skin: No rashes cyanosis or petechiae. Extremities: No clubbing or edema  Neurologic Exam: Mental Status: Awake, alert, attentive to examiner. Oriented to self and environment. Language is fluent with intact comprehension.  Cranial Nerves: Visual acuity is grossly normal from left eye. Visual fields are full. Extra-ocular movements intact in left eye. Face is symmetric, tongue midline.  Impaired facial sensation in V1-V3 distributions Motor: Tone and bulk are normal. Power is full in both arms and legs. Reflexes are symmetric, no pathologic reflexes present. Intact finger to nose bilaterally Sensory: Intact to light touch and temperature Gait: Normal and tandem gait is normal.   Labs: I have reviewed the data as listed    Component Value Date/Time   NA 134 (L) 10/03/2016  0247   K 4.4 10/03/2016 0247   CL 96 (L) 10/03/2016 0247   CO2 29 10/03/2016 0247   GLUCOSE 147 (H) 10/03/2016 0247   BUN 12 10/03/2016 0247   CREATININE 0.94 10/03/2016 0247   CALCIUM 9.3 10/03/2016 0247   PROT 7.6 10/03/2016 0247   ALBUMIN 3.7 10/03/2016 0247   AST 27 10/03/2016 0247   ALT 22 10/03/2016 0247   ALKPHOS 75 10/03/2016 0247   BILITOT 1.3 (H) 10/03/2016 0247   GFRNONAA >60 10/03/2016 0247   GFRAA >60 10/03/2016 0247   Lab Results  Component Value Date   WBC 10.3 10/03/2016   NEUTROABS 2.7 06/18/2011   HGB 12.8 10/03/2016   HCT 39.9 10/03/2016   MCV 86.4 10/03/2016   PLT 336 10/03/2016    Imaging: [facility] Clinician Interpretation: I have personally reviewed the CNS images as listed.  My interpretation, in the context of the patient's clinical presentation, is stable disease  Ct Head Wo Contrast  Result Date: 05/22/2017 CLINICAL DATA:  History  of brain meningioma resection, 09/2016. Drainage from RIGHT ear. EXAM: CT HEAD WITHOUT CONTRAST TECHNIQUE: Contiguous axial images were obtained from the base of the skull through the vertex without intravenous contrast. COMPARISON:  MR brain 05/22/2017. FINDINGS: Brain: No evidence for acute stroke, acute hemorrhage, hydrocephalus, or extra-axial fluid. Partial RIGHT temporal lobectomy. Residual sellar tumor extends to both cavernous sinuses, greater on RIGHT. Vascular: No hyperdense vessel or unexpected calcification. Skull: RIGHT pterional craniotomy/craniectomy appears uncomplicated. Study was not performed as a dedicated temporal bone study, but coronal reformatted imaging through the skull base demonstrates dehiscence and fragmentary change along the floor of the middle cranial fossa, just anterior to the tegmen tympani. This lies along the anterior margin of the petrous apex. See for instance image 163 series 5. This correlates with fluid in the petrous apex air cells on coronal T2 MR; see for instance image 27 series 9. 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 Sinuses/Orbits: No significant sinus opacity or layering fluid. Negative orbits. Other: None. 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 Recommend dedicated CT temporal bone with contrast for further evaluation. No acute intracranial findings.  Postsurgical changes as described. Electronically Signed   By: Staci Righter M.D.   On: 05/22/2017 10:52   Mr [name] NW Contrast  Result Date: 05/22/2017 CLINICAL DATA:  Meningioma. Craniotomy 09/25/2016 for resection. Radiation treatment completed. Creatinine was obtained on site at [facility] at [address] Results: Creatinine 1.0 mg/dL. EXAM: MRI HEAD WITHOUT AND WITH CONTRAST TECHNIQUE: Multiplanar, multiecho pulse  sequences of the brain and surrounding structures were obtained without and with intravenous contrast. CONTRAST:  33mL MULTIHANCE GADOBENATE DIMEGLUMINE 529 MG/ML IV SOLN COMPARISON:  MRI 12/25/2016, 10/17/2016 FINDINGS: Brain: Right pterional craniotomy for resection of cavernous sinus meningioma on the right. Right cavernous sinus enhancing tumor is unchanged in size and volume. Tumor extends into the sella which is unchanged. Suprasellar tumor with flattening of the optic chiasm and right optic nerve unchanged. There is encasement of the right internal carotid artery which remains patent. Tumor extends into the prepontine cistern on the right, similar in volume. There is encasement of the cisternal segment of the trigeminal nerve as well as invasion of the trigeminal ganglion. Tumor extends along the tentorium on the right which is unchanged. Postop encephalomalacia right inferior temporal lobe. Negative for  acute infarct. Negative for hydrocephalus. No midline shift. No other enhancing mass lesion identified in the brain. Vascular: Normal flow voids at the base the brain. Tumor encasement of the right cavernous carotid. Skull and upper cervical spine: No acute skull abnormality. Right pterional craniotomy. Sinuses/Orbits: Mild mucosal edema in the paranasal sinuses. Normal orbit Other: Abnormal signal in the muscles of mastication on the right with some atrophy and hyperintensity on T2. This is due to denervation related to V3 involvement. Tumoral overlies the foramen ovale. IMPRESSION: Postop resection and radiation for trans spatial cavernous meningioma on the right. Overall tumor is similar in volume to the prior study. Tumor extends into the sella and suprasellar cistern, unchanged. Tumor extends into the prepontine cistern unchanged. There encasement of the cisternal segment of the trigeminal nerve on the right. Tumor overlies the foramina ovale with denervation atrophic changes in the muscles of  mastication on the right similar to the prior study. Encasement of the right cavernous sinus again noted which remains patent. Electronically Signed   By: Franchot Gallo M.D.   On: 05/22/2017 10:25    Pathology:   Assessment/Plan Meningioma [facility])  We appreciate the opportunity to participate in the care of [facility].  She is clinically and radiographically stable today.  Screening for potential clinical trials was performed and discussed using eligibility criteria for active protocols at [facility], loco-regional tertiary centers, as well as national database available on [email or web address].    The patient is not a candidate for a research protocol at this time due to stable disease.   We spent twenty additional minutes teaching regarding the natural history, biology, and historical experience in the treatment of brain tumors. We then discussed in detail the current recommendations for therapy focusing on the mode of administration, mechanism of action, anticipated toxicities, and quality of life issues associated with this plan. We also provided teaching sheets for the patient to take home as an additional resource.  We recommend she return to clinic in 4-6 months with an MRI brain for review.    All questions were answered. The patient knows to call the clinic with any problems, questions or concerns. No barriers to learning were detected.  The total time spent in the encounter was 60 minutes and more than 50% was on counseling and review of test results   Ventura Sellers, MD Medical Director of Neuro-[facility] at [place] 05/29/17 3:56 PM

## 2017-06-02 ENCOUNTER — Ambulatory Visit: Payer: BLUE CROSS/BLUE SHIELD | Admitting: Nutrition

## 2017-06-02 ENCOUNTER — Encounter: Payer: BLUE CROSS/BLUE SHIELD | Attending: Endocrinology | Admitting: Registered"

## 2017-06-02 ENCOUNTER — Encounter: Payer: Self-pay | Admitting: Registered"

## 2017-06-02 DIAGNOSIS — E1165 Type 2 diabetes mellitus with hyperglycemia: Secondary | ICD-10-CM | POA: Diagnosis present

## 2017-06-02 DIAGNOSIS — Z713 Dietary counseling and surveillance: Secondary | ICD-10-CM | POA: Diagnosis not present

## 2017-06-02 DIAGNOSIS — E118 Type 2 diabetes mellitus with unspecified complications: Secondary | ICD-10-CM | POA: Insufficient documentation

## 2017-06-02 NOTE — Patient Instructions (Signed)
Plan:  Aim for 3-4 Carb Choices per meal (45-60 grams)   Aim for 0-1 Carbs per snack if hungry Consider including lunch in your daily routine more often Consider checking your blood sugar after a pasta dinner and adjust portion size as needed to keep your blood sugar under 180 mg/dL 2 hrs after the meal.  Include protein with your meals and snacks Consider reading food labels for Total Carbohydrate  Continue your activity level as tolerated Continue taking medication as directed by MD

## 2017-06-02 NOTE — Progress Notes (Signed)
Diabetes Self-Management Education  Visit Type: First/Initial  Appt. Start Time: 0835 Appt. End Time: 0945  06/02/2017  [name], identified by name and date of birth, is a 52 y.o. female with a diagnosis of Diabetes: Type 2.   ASSESSMENT Patient states her eating habits have changed over the last 2 years and continued to change more over the last 6 months after having a meningioma removed (crainotomy 09/2016 per referral). After surgery patient states she does not have hunger cues and has to think about eating. Patient states she has been losing weight over the last 2 years; 220lb - 180lb.  Patient states she has been a pescatarian for 5 years and has beans as her protein source often. Patient states the carb which she does enjoy is pasta and an occasional sweet tea from [employer].   Patient states her energy level stays high and feels rested with ~6 1/2 hrs of sleep.  Diabetes Self-Management Education - 06/02/17 0834      Visit Information   Visit Type  First/Initial      Initial Visit   Diabetes Type  Type 2    Are you currently following a meal plan?  No    Are you taking your medications as prescribed?  Yes metformin, Januvia    Date Diagnosed  2012      Health Coping   How would you rate your overall health?  Excellent      Psychosocial Assessment   Patient Belief/Attitude about Diabetes  Motivated to manage diabetes    How often do you need to have someone help you when you read instructions, pamphlets, or other written materials from your doctor or pharmacy?  1 - Never    What is the last grade level you completed in school?  college graduate      Complications   Last HgB A1C per patient/outside source  7.3 % 7.3 per referral    How often do you check your blood sugar?  1-2 times/day    Fasting Blood glucose range (mg/dL)  70-129;130-179 114-155    Postprandial Blood glucose range (mg/dL)  130-179    Number of hyperglycemic episodes per week  1    Have you  had a dilated eye exam in the past 12 months?  Yes    Have you had a dental exam in the past 12 months?  Yes    Are you checking your feet?  No      Dietary Intake   Breakfast  banana, 2 c coffee flavored creamer     Snack (morning)  coffee    Lunch  usually just has a snack 2-3 x week leftovers    Snack (afternoon)  peanut butter crakers and or nuts, dried fruit    Dinner  veg soup OR veggie spaghetti OR broccoli casserole OR spinach, fruit smoothie, protein mix    Snack (evening)  none    Beverage(s)  coffee, at least 32 oz water, cocconut water, 2x week 32 oz sweet tea sips on all day      Exercise   Exercise Type  Light (walking / raking leaves);Moderate (swimming / aerobic walking)    How many days per week to you exercise?  7    How many minutes per day do you exercise?  30    Total minutes per week of exercise  210      Patient Education   Previous Diabetes Education  No    Disease state  Definition of diabetes, type 1 and 2, and the diagnosis of diabetes    Nutrition management   Role of diet in the treatment of diabetes and the relationship between the three main macronutrients and blood glucose level;Carbohydrate counting    Physical activity and exercise   Role of exercise on diabetes management, blood pressure control and cardiac health.    Medications  Reviewed patients medication for diabetes, action, purpose, timing of dose and side effects.    Monitoring  Identified appropriate SMBG and/or A1C goals.    Acute complications  Taught treatment of hypoglycemia - the 15 rule.    Chronic complications  Relationship between chronic complications and blood glucose control      Individualized Goals (developed by patient)   Nutrition  General guidelines for healthy choices and portions discussed      Outcomes   Expected Outcomes  Demonstrated interest in learning. Expect positive outcomes    Future DMSE  6 months    Program Status  Completed     Individualized Plan for  Diabetes Self-Management Training:   Learning Objective:  Patient will have a greater understanding of diabetes self-management. Patient education plan is to attend individual and/or group sessions per assessed needs and concerns.  Patient Instructions  Plan:  Aim for 3-4 Carb Choices per meal (45-60 grams)   Aim for 0-1 Carbs per snack if hungry Consider including lunch in your daily routine more often Consider checking your blood sugar after a pasta dinner and adjust portion size as needed to keep your blood sugar under 180 mg/dL 2 hrs after the meal.  Include protein with your meals and snacks Consider reading food labels for Total Carbohydrate  Continue your activity level as tolerated Continue taking medication as directed by MD   Expected Outcomes:  Demonstrated interest in learning. Expect positive outcomes  Education material provided: Living Well with Diabetes, A1C conversion sheet, My Plate and Carbohydrate counting sheet  If problems or questions, patient to contact team via:  Phone  Future DSME appointment: 6 months

## 2017-06-17 NOTE — Pre-Procedure Instructions (Addendum)
[name]  [birth year]      [facility] [address], [place] [address] Phone: [phone] Fax: [phone]  [id number] [name], [place] [address] Phone: [phone] Fax: [phone]    Your procedure is scheduled on Feb. 13  Report to [facility] Admitting at 7:00 A.M.  Call this number if you have problems the morning of surgery:  [phone]   Remember:  Do not eat food or drink liquids after midnight on Feb. 12   Take these medicines the morning of surgery with A SIP OF WATER : levetiracetam              7 days prior to surgery STOP taking any Aspirin(unless otherwise instructed by your surgeon), Aleve, Naproxen, Ibuprofen, Motrin, Advil, Goody's, BC's, all herbal medications, fish oil, and all vitamins                    How to Manage Your Diabetes Before and After Surgery  Why is it important to control my blood sugar before and after surgery? . Improving blood sugar levels before and after surgery helps healing and can limit problems. . A way of improving blood sugar control is eating a healthy diet by: o  Eating less sugar and carbohydrates o  Increasing activity/exercise o  Talking with your doctor about reaching your blood sugar goals . High blood sugars (greater than 180 mg/dL) can raise your risk of infections and slow your recovery, so you will need to focus on controlling your diabetes during the weeks before surgery. . Make sure that the doctor who takes care of your diabetes knows about your planned surgery including the date and location.  How do I manage my blood sugar before surgery? . Check your blood sugar at least 4 times a day, starting 2 days before surgery, to make sure that the level is not too high or low. o Check your blood sugar the morning of your surgery when you wake up and every  2 hours until you get to the Short Stay unit. . If your blood sugar is less than 70 mg/dL, you will need to treat for low blood sugar: o Do not take insulin. o Treat a low blood sugar (less than 70 mg/dL) with  cup of clear juice (cranberry or apple), 4 glucose tablets, OR glucose gel. Recheck blood sugar in 15 minutes after treatment (to make sure it is greater than 70 mg/dL). If your blood sugar is not greater than 70 mg/dL on recheck, call [phone] o  for further instructions. . Report your blood sugar to the short stay nurse when you get to Short Stay.  . If you are admitted to the hospital after surgery: o Your blood sugar will be checked by the staff and you will probably be given insulin after surgery (instead of oral diabetes medicines) to make sure you have good blood sugar levels. o The goal for blood sugar control after surgery is 80-180 mg/dL.       WHAT DO I DO ABOUT MY DIABETES MEDICATION?   Marland Kitchen Do not take oral diabetes medicines (pills) the morning of surgery.    Do not wear jewelry, make-up or nail polish.  Do not wear lotions, powders, or perfumes, or deodorant.  Do not shave 48 hours prior to surgery.  Men  may shave face and neck.  Do not bring valuables to the hospital.  [facility] is not responsible for any belongings or valuables.  Contacts, dentures or bridgework may not be worn into surgery.  Leave your suitcase in the car.  After surgery it may be brought to your room.  For patients admitted to the hospital, discharge time will be determined by your treatment team.  Patients discharged the day of surgery will not be allowed to drive home.    Special instructions:  [place]- Preparing For Surgery  Before surgery, you can play an important role. Because skin is not sterile, your skin needs to be as free of germs as possible. You can reduce the number of germs on your skin by washing with CHG (chlorahexidine gluconate) Soap before surgery.  CHG is an  antiseptic cleaner which kills germs and bonds with the skin to continue killing germs even after washing.  Please do not use if you have an allergy to CHG or antibacterial soaps. If your skin becomes reddened/irritated stop using the CHG.  Do not shave (including legs and underarms) for at least 48 hours prior to first CHG shower. It is OK to shave your face.  Please follow these instructions carefully.   1. Shower the NIGHT BEFORE SURGERY and the MORNING OF SURGERY with CHG.   2. If you chose to wash your hair, wash your hair first as usual with your normal shampoo.  3. After you shampoo, rinse your hair and body thoroughly to remove the shampoo.  4. Use CHG as you would any other liquid soap. You can apply CHG directly to the skin and wash gently with a scrungie or a clean washcloth.   5. Apply the CHG Soap to your body ONLY FROM THE NECK DOWN.  Do not use on open wounds or open sores. Avoid contact with your eyes, ears, mouth and genitals (private parts). Wash Face and genitals (private parts)  with your normal soap.  6. Wash thoroughly, paying special attention to the area where your surgery will be performed.  7. Thoroughly rinse your body with warm water from the neck down.  8. DO NOT shower/wash with your normal soap after using and rinsing off the CHG Soap.  9. Pat yourself dry with a CLEAN TOWEL.  10. Wear CLEAN PAJAMAS to bed the night before surgery, wear comfortable clothes the morning of surgery  11. Place CLEAN SHEETS on your bed the night of your first shower and DO NOT SLEEP WITH PETS.    Day of Surgery: Do not apply any deodorants/lotions. Please wear clean clothes to the hospital/surgery center.      Please read over the following fact sheets that you were given. Coughing and Deep Breathing and Surgical Site Infection Prevention

## 2017-06-18 ENCOUNTER — Other Ambulatory Visit: Payer: Self-pay

## 2017-06-18 ENCOUNTER — Encounter (HOSPITAL_COMMUNITY)
Admission: RE | Admit: 2017-06-18 | Discharge: 2017-06-18 | Disposition: A | Discharge status: Home / Self Care | Payer: BLUE CROSS/BLUE SHIELD | Source: Ambulatory Visit | Attending: Neurosurgery | Admitting: Neurosurgery

## 2017-06-18 ENCOUNTER — Encounter (HOSPITAL_COMMUNITY): Payer: Self-pay

## 2017-06-18 DIAGNOSIS — R51 Headache: Secondary | ICD-10-CM | POA: Insufficient documentation

## 2017-06-18 DIAGNOSIS — Z01812 Encounter for preprocedural laboratory examination: Secondary | ICD-10-CM | POA: Insufficient documentation

## 2017-06-18 DIAGNOSIS — E1165 Type 2 diabetes mellitus with hyperglycemia: Secondary | ICD-10-CM | POA: Insufficient documentation

## 2017-06-18 DIAGNOSIS — Z7984 Long term (current) use of oral hypoglycemic drugs: Secondary | ICD-10-CM | POA: Diagnosis not present

## 2017-06-18 DIAGNOSIS — R569 Unspecified convulsions: Secondary | ICD-10-CM | POA: Diagnosis not present

## 2017-06-18 DIAGNOSIS — I10 Essential (primary) hypertension: Secondary | ICD-10-CM | POA: Diagnosis not present

## 2017-06-18 DIAGNOSIS — D329 Benign neoplasm of meninges, unspecified: Secondary | ICD-10-CM | POA: Insufficient documentation

## 2017-06-18 HISTORY — DX: Unspecified convulsions: R56.9

## 2017-06-18 LAB — BASIC METABOLIC PANEL
Anion gap: 11 (ref 5–15)
BUN: 7 mg/dL (ref 6–20)
CHLORIDE: 102 mmol/L (ref 101–111)
CO2: 26 mmol/L (ref 22–32)
Calcium: 9.2 mg/dL (ref 8.9–10.3)
Creatinine, Ser: 0.9 mg/dL (ref 0.44–1.00)
GFR calc Af Amer: >60 mL/min (ref >60)
GFR calc non Af Amer: >60 mL/min (ref >60)
GLUCOSE: 98 mg/dL (ref 65–99)
POTASSIUM: 4.1 mmol/L (ref 3.5–5.1)
Sodium: 139 mmol/L (ref 135–145)

## 2017-06-18 LAB — CBC
HEMATOCRIT: 40.5 % (ref 36.0–46.0)
Hemoglobin: 12.8 g/dL (ref 12.0–15.0)
MCH: 27.3 pg (ref 26.0–34.0)
MCHC: 31.6 g/dL (ref 30.0–36.0)
MCV: 86.4 fL (ref 78.0–100.0)
Platelets: 251 10*3/uL (ref 150–400)
RBC: 4.69 MIL/uL (ref 3.87–5.11)
RDW: 13.9 % (ref 11.5–15.5)
WBC: 4.3 10*3/uL (ref 4.0–10.5)

## 2017-06-18 LAB — TYPE AND SCREEN
ABO/RH(D): A POS
Antibody Screen: NEGATIVE

## 2017-06-18 LAB — SURGICAL PCR SCREEN
MRSA, PCR: NEGATIVE
STAPHYLOCOCCUS AUREUS: NEGATIVE

## 2017-06-18 LAB — HEMOGLOBIN A1C
Hgb A1c MFr Bld: 6.8 % — ABNORMAL HIGH (ref 4.8–5.6)
Mean Plasma Glucose: 148.46 mg/dL

## 2017-06-18 LAB — GLUCOSE, CAPILLARY: GLUCOSE-CAPILLARY: 104 mg/dL — AB (ref 65–99)

## 2017-06-18 NOTE — Progress Notes (Signed)
PCP: Dr. Elisabeth Pigeon in [place], [place]  Cardiologist: [place]   Endocrinologist: Dr. Criss Rosales  Fasting sugars 90-100

## 2017-06-20 NOTE — Progress Notes (Signed)
Anesthesia Chart Review:  Pt is a 52 year old female scheduled for cranioplasty for R pterional defect 06/25/2017 with Ashok Pall, M.D.  - PCP is Elisabeth Pigeon, NP  PMH includes: HTN, PVCs, DM. Former smoker. BMI 26.5. S/p craniotomy 09/25/16. S/p R unicompartmental knee 06/24/11.   BP 129/90   Pulse 72   Temp 36.9 C   Resp 18   Ht 5\' 10"  (1.778 m)   Wt 185 lb 3.2 oz (84 kg)   LMP 03/18/2017   SpO2 100%   BMI 26.57 kg/m    Medications include: Benazepril, keppra, metformin, pravastatin, sitagliptin.  Preoperative labs reviewed. HbA1c 9.7, glucose 156.  EKG 09/17/16: NSR.  Echo 09/20/14 (done for heart murmur at [facility]; found in correspondence 09/29/16 in media tab):  1. LA is dilated. 2. LV is mildly dilated. Mild LVH. Normal LV contraction. EF 60%. 3. No OSA M seen. 4. Mild MR. 5. TAPSE: 23.5  If no changes, I anticipate pt can proceed with surgery as scheduled.   Willeen Cass, FNP-BC [facility]/Anesthesiology Phone: [phone] 06/20/2017 12:36 PM

## 2017-06-25 ENCOUNTER — Inpatient Hospital Stay (HOSPITAL_COMMUNITY): Payer: BLUE CROSS/BLUE SHIELD

## 2017-06-25 ENCOUNTER — Encounter (HOSPITAL_COMMUNITY): Payer: Self-pay | Admitting: *Deleted

## 2017-06-25 ENCOUNTER — Inpatient Hospital Stay (HOSPITAL_COMMUNITY): Payer: BLUE CROSS/BLUE SHIELD | Admitting: Emergency Medicine

## 2017-06-25 ENCOUNTER — Inpatient Hospital Stay (HOSPITAL_COMMUNITY)
Admission: RE | Admit: 2017-06-25 | Discharge: 2017-06-26 | DRG: 027 | Disposition: A | Discharge status: Home / Self Care | Payer: BLUE CROSS/BLUE SHIELD | Source: Ambulatory Visit | Attending: Neurosurgery | Admitting: Neurosurgery

## 2017-06-25 ENCOUNTER — Inpatient Hospital Stay (HOSPITAL_COMMUNITY)
Admission: RE | Disposition: A | Discharge status: Home / Self Care | Payer: Self-pay | Source: Ambulatory Visit | Attending: Neurosurgery

## 2017-06-25 DIAGNOSIS — Q759 Congenital malformation of skull and face bones, unspecified: Secondary | ICD-10-CM

## 2017-06-25 DIAGNOSIS — Z87891 Personal history of nicotine dependence: Secondary | ICD-10-CM | POA: Diagnosis not present

## 2017-06-25 DIAGNOSIS — Z86011 Personal history of benign neoplasm of the brain: Secondary | ICD-10-CM

## 2017-06-25 DIAGNOSIS — G960 Cerebrospinal fluid leak: Secondary | ICD-10-CM | POA: Diagnosis present

## 2017-06-25 DIAGNOSIS — I10 Essential (primary) hypertension: Secondary | ICD-10-CM | POA: Diagnosis present

## 2017-06-25 DIAGNOSIS — G9601 Cranial cerebrospinal fluid leak, spontaneous: Secondary | ICD-10-CM | POA: Diagnosis present

## 2017-06-25 DIAGNOSIS — Z7984 Long term (current) use of oral hypoglycemic drugs: Secondary | ICD-10-CM

## 2017-06-25 DIAGNOSIS — R569 Unspecified convulsions: Secondary | ICD-10-CM | POA: Diagnosis present

## 2017-06-25 DIAGNOSIS — E119 Type 2 diabetes mellitus without complications: Secondary | ICD-10-CM | POA: Diagnosis present

## 2017-06-25 HISTORY — PX: CRANIOPLASTY: SHX1407

## 2017-06-25 LAB — GLUCOSE, CAPILLARY
GLUCOSE-CAPILLARY: 85 mg/dL (ref 65–99)
GLUCOSE-CAPILLARY: 76 mg/dL (ref 65–99)

## 2017-06-25 LAB — POCT PREGNANCY, URINE: Preg Test, Ur: NEGATIVE

## 2017-06-25 SURGERY — CRANIOPLASTY
Anesthesia: General | Site: Head | Laterality: Right

## 2017-06-25 MED ORDER — ONDANSETRON HCL 4 MG PO TABS: 4.0000 mg | ORAL_TABLET | ORAL | Status: DC | PRN

## 2017-06-25 MED ORDER — ROCURONIUM BROMIDE 10 MG/ML (PF) SYRINGE
PREFILLED_SYRINGE | INTRAVENOUS | Status: AC
  Filled 2017-06-25: qty 5

## 2017-06-25 MED ORDER — FENTANYL CITRATE (PF) 100 MCG/2ML IJ SOLN
INTRAMUSCULAR | Status: DC | PRN
  Administered 2017-06-25: 50 ug via INTRAVENOUS
  Administered 2017-06-25: 100 ug via INTRAVENOUS

## 2017-06-25 MED ORDER — HEMOSTATIC AGENTS (NO CHARGE) OPTIME
TOPICAL | Status: DC | PRN
  Administered 2017-06-25: 1 via TOPICAL

## 2017-06-25 MED ORDER — ONDANSETRON HCL 4 MG/2ML IJ SOLN
INTRAMUSCULAR | Status: DC | PRN
  Administered 2017-06-25: 4 mg via INTRAVENOUS

## 2017-06-25 MED ORDER — CEFAZOLIN SODIUM-DEXTROSE 2-4 GM/100ML-% IV SOLN
2.0000 g | Freq: Three times a day (TID) | INTRAVENOUS | Status: AC
  Administered 2017-06-25 – 2017-06-26 (×2): 2 g via INTRAVENOUS
  Filled 2017-06-25 (×2): qty 100

## 2017-06-25 MED ORDER — PROPOFOL 10 MG/ML IV BOLUS
INTRAVENOUS | Status: AC
  Filled 2017-06-25: qty 20

## 2017-06-25 MED ORDER — FENTANYL CITRATE (PF) 250 MCG/5ML IJ SOLN
INTRAMUSCULAR | Status: AC
  Filled 2017-06-25: qty 5

## 2017-06-25 MED ORDER — CEFAZOLIN SODIUM-DEXTROSE 2-4 GM/100ML-% IV SOLN
INTRAVENOUS | Status: AC
  Filled 2017-06-25: qty 100

## 2017-06-25 MED ORDER — SUCCINYLCHOLINE CHLORIDE 200 MG/10ML IV SOSY
PREFILLED_SYRINGE | INTRAVENOUS | Status: AC
  Filled 2017-06-25: qty 10

## 2017-06-25 MED ORDER — VITAMIN B 12 100 MCG PO LOZG: 1.0000 | LOZENGE | Freq: Every day | ORAL | Status: DC

## 2017-06-25 MED ORDER — SENNA 8.6 MG PO TABS
1.0000 | ORAL_TABLET | Freq: Two times a day (BID) | ORAL | Status: DC
  Administered 2017-06-25: 8.6 mg via ORAL
  Filled 2017-06-25: qty 1

## 2017-06-25 MED ORDER — SODIUM CHLORIDE 0.9 % IV SOLN
INTRAVENOUS | Status: DC
Start: 2017-06-25 — End: 2017-06-25
  Administered 2017-06-25: 25 mL/h via INTRAVENOUS

## 2017-06-25 MED ORDER — LEVETIRACETAM 500 MG PO TABS
500.0000 mg | ORAL_TABLET | Freq: Two times a day (BID) | ORAL | Status: DC
  Administered 2017-06-25 – 2017-06-26 (×2): 500 mg via ORAL
  Filled 2017-06-25 (×2): qty 1

## 2017-06-25 MED ORDER — CHLORHEXIDINE GLUCONATE CLOTH 2 % EX PADS: 6.0000 | MEDICATED_PAD | Freq: Once | CUTANEOUS | Status: DC

## 2017-06-25 MED ORDER — CEFAZOLIN SODIUM-DEXTROSE 2-4 GM/100ML-% IV SOLN
2.0000 g | INTRAVENOUS | Status: AC
  Administered 2017-06-25: 2 g via INTRAVENOUS

## 2017-06-25 MED ORDER — LIDOCAINE-EPINEPHRINE 0.5 %-1:200000 IJ SOLN
INTRAMUSCULAR | Status: DC | PRN
  Administered 2017-06-25: 10 mL

## 2017-06-25 MED ORDER — ONDANSETRON HCL 4 MG/2ML IJ SOLN
INTRAMUSCULAR | Status: AC
  Filled 2017-06-25: qty 2

## 2017-06-25 MED ORDER — PRAVASTATIN SODIUM 20 MG PO TABS
20.0000 mg | ORAL_TABLET | Freq: Every day | ORAL | Status: DC
  Administered 2017-06-26: 20 mg via ORAL
  Filled 2017-06-25: qty 1

## 2017-06-25 MED ORDER — OXYCODONE HCL 5 MG PO TABS: 5.0000 mg | ORAL_TABLET | Freq: Once | ORAL | Status: DC | PRN

## 2017-06-25 MED ORDER — NALOXONE HCL 0.4 MG/ML IJ SOLN: 0.0800 mg | INTRAMUSCULAR | Status: DC | PRN

## 2017-06-25 MED ORDER — LABETALOL HCL 5 MG/ML IV SOLN: 10.0000 mg | INTRAVENOUS | Status: DC | PRN

## 2017-06-25 MED ORDER — BACITRACIN ZINC 500 UNIT/GM EX OINT
TOPICAL_OINTMENT | CUTANEOUS | Status: DC | PRN
  Administered 2017-06-25: 1 via TOPICAL

## 2017-06-25 MED ORDER — ACETAMINOPHEN 325 MG PO TABS: 650.0000 mg | ORAL_TABLET | ORAL | Status: DC | PRN

## 2017-06-25 MED ORDER — LINAGLIPTIN 5 MG PO TABS
5.0000 mg | ORAL_TABLET | Freq: Every day | ORAL | Status: DC
  Filled 2017-06-25 (×2): qty 1

## 2017-06-25 MED ORDER — ONDANSETRON HCL 4 MG/2ML IJ SOLN: 4.0000 mg | INTRAMUSCULAR | Status: DC | PRN

## 2017-06-25 MED ORDER — ROCURONIUM BROMIDE 100 MG/10ML IV SOLN
INTRAVENOUS | Status: DC | PRN
  Administered 2017-06-25: 20 mg via INTRAVENOUS
  Administered 2017-06-25: 40 mg via INTRAVENOUS

## 2017-06-25 MED ORDER — EPHEDRINE 5 MG/ML INJ
INTRAVENOUS | Status: AC
  Filled 2017-06-25: qty 10

## 2017-06-25 MED ORDER — ACETAMINOPHEN 650 MG RE SUPP: 650.0000 mg | RECTAL | Status: DC | PRN

## 2017-06-25 MED ORDER — 0.9 % SODIUM CHLORIDE (POUR BTL) OPTIME
TOPICAL | Status: DC | PRN
  Administered 2017-06-25 (×2): 1000 mL

## 2017-06-25 MED ORDER — POTASSIUM CHLORIDE IN NACL 20-0.9 MEQ/L-% IV SOLN
INTRAVENOUS | Status: DC
  Administered 2017-06-25: 80 mL/h via INTRAVENOUS
  Administered 2017-06-26: 05:00:00 via INTRAVENOUS
  Filled 2017-06-25 (×3): qty 1000

## 2017-06-25 MED ORDER — OXYCODONE HCL 5 MG/5ML PO SOLN: 5.0000 mg | Freq: Once | ORAL | Status: DC | PRN

## 2017-06-25 MED ORDER — HYDROCODONE-ACETAMINOPHEN 5-325 MG PO TABS: 1.0000 | ORAL_TABLET | ORAL | Status: DC | PRN

## 2017-06-25 MED ORDER — MAGNESIUM CITRATE PO SOLN: 1.0000 | Freq: Once | ORAL | Status: DC | PRN

## 2017-06-25 MED ORDER — BENAZEPRIL HCL 20 MG PO TABS
10.0000 mg | ORAL_TABLET | Freq: Every day | ORAL | Status: DC
  Administered 2017-06-26: 10 mg via ORAL
  Filled 2017-06-25: qty 1

## 2017-06-25 MED ORDER — ADULT MULTIVITAMIN W/MINERALS CH: 1.0000 | ORAL_TABLET | Freq: Every day | ORAL | Status: DC

## 2017-06-25 MED ORDER — BISACODYL 5 MG PO TBEC: 5.0000 mg | DELAYED_RELEASE_TABLET | Freq: Every day | ORAL | Status: DC | PRN

## 2017-06-25 MED ORDER — DEXTROSE 5 % IV SOLN
INTRAVENOUS | Status: DC | PRN
  Administered 2017-06-25: 10 ug/min via INTRAVENOUS

## 2017-06-25 MED ORDER — PANTOPRAZOLE SODIUM 40 MG IV SOLR: 40.0000 mg | Freq: Every day | INTRAVENOUS | Status: DC

## 2017-06-25 MED ORDER — FOLIC ACID 1 MG PO TABS: 1.0000 mg | ORAL_TABLET | Freq: Every day | ORAL | Status: DC

## 2017-06-25 MED ORDER — SUGAMMADEX SODIUM 200 MG/2ML IV SOLN
INTRAVENOUS | Status: DC | PRN
  Administered 2017-06-25: 200 mg via INTRAVENOUS

## 2017-06-25 MED ORDER — LIDOCAINE HCL (CARDIAC) 20 MG/ML IV SOLN
INTRAVENOUS | Status: DC | PRN
  Administered 2017-06-25: 60 mg via INTRAVENOUS

## 2017-06-25 MED ORDER — PROMETHAZINE HCL 25 MG/ML IJ SOLN: 6.2500 mg | INTRAMUSCULAR | Status: DC | PRN

## 2017-06-25 MED ORDER — PHENYLEPHRINE 40 MCG/ML (10ML) SYRINGE FOR IV PUSH (FOR BLOOD PRESSURE SUPPORT)
PREFILLED_SYRINGE | INTRAVENOUS | Status: AC
  Filled 2017-06-25: qty 10

## 2017-06-25 MED ORDER — MORPHINE SULFATE (PF) 4 MG/ML IV SOLN
1.0000 mg | INTRAVENOUS | Status: DC | PRN
  Administered 2017-06-25 – 2017-06-26 (×2): 1 mg via INTRAVENOUS
  Filled 2017-06-25 (×2): qty 1

## 2017-06-25 MED ORDER — THROMBIN 20000 UNITS EX SOLR
CUTANEOUS | Status: AC
  Filled 2017-06-25: qty 20000

## 2017-06-25 MED ORDER — HYDROMORPHONE HCL 1 MG/ML IJ SOLN: 0.2500 mg | INTRAMUSCULAR | Status: DC | PRN

## 2017-06-25 MED ORDER — SURGIFOAM 100 EX MISC
CUTANEOUS | Status: DC | PRN
  Administered 2017-06-25: 20 mL via TOPICAL

## 2017-06-25 MED ORDER — VITAMIN D 1000 UNITS PO TABS: 1000.0000 [IU] | ORAL_TABLET | Freq: Every day | ORAL | Status: DC

## 2017-06-25 MED ORDER — DEXAMETHASONE SODIUM PHOSPHATE 10 MG/ML IJ SOLN
INTRAMUSCULAR | Status: DC | PRN
  Administered 2017-06-25: 10 mg via INTRAVENOUS

## 2017-06-25 MED ORDER — SENNOSIDES-DOCUSATE SODIUM 8.6-50 MG PO TABS: 1.0000 | ORAL_TABLET | Freq: Every evening | ORAL | Status: DC | PRN

## 2017-06-25 MED ORDER — PROMETHAZINE HCL 25 MG PO TABS: 12.5000 mg | ORAL_TABLET | ORAL | Status: DC | PRN

## 2017-06-25 MED ORDER — LIDOCAINE 2% (20 MG/ML) 5 ML SYRINGE
INTRAMUSCULAR | Status: AC
  Filled 2017-06-25: qty 5

## 2017-06-25 MED ORDER — SUGAMMADEX SODIUM 200 MG/2ML IV SOLN
INTRAVENOUS | Status: AC
  Filled 2017-06-25: qty 2

## 2017-06-25 MED ORDER — DEXAMETHASONE SODIUM PHOSPHATE 10 MG/ML IJ SOLN
INTRAMUSCULAR | Status: AC
  Filled 2017-06-25: qty 1

## 2017-06-25 MED ORDER — LIDOCAINE-EPINEPHRINE 0.5 %-1:200000 IJ SOLN
INTRAMUSCULAR | Status: AC
  Filled 2017-06-25: qty 1

## 2017-06-25 MED ORDER — METFORMIN HCL 500 MG PO TABS
500.0000 mg | ORAL_TABLET | Freq: Two times a day (BID) | ORAL | Status: DC
  Administered 2017-06-25 – 2017-06-26 (×2): 500 mg via ORAL
  Filled 2017-06-25 (×2): qty 1

## 2017-06-25 MED ORDER — PROPOFOL 10 MG/ML IV BOLUS
INTRAVENOUS | Status: DC | PRN
  Administered 2017-06-25: 200 mg via INTRAVENOUS
  Administered 2017-06-25: 50 mg via INTRAVENOUS

## 2017-06-25 SURGICAL SUPPLY — 85 items
ADH SKN CLS APL DERMABOND .7 (GAUZE/BANDAGES/DRESSINGS)
APL SRG 60D 8 XTD TIP BNDBL (TIP) ×1
BANDAGE GAUZE 4  KLING STR (GAUZE/BANDAGES/DRESSINGS) IMPLANT
BLADE CLIPPER SURG (BLADE) IMPLANT
BNDG CMPR 75X41 PLY ABS (GAUZE/BANDAGES/DRESSINGS) ×1
BNDG GAUZE ELAST 4 BULKY (GAUZE/BANDAGES/DRESSINGS) ×4 IMPLANT
BNDG STRETCH 4X75 NS LF (GAUZE/BANDAGES/DRESSINGS) ×2 IMPLANT
CABLE BIPOLOR RESECTION CORD (MISCELLANEOUS) ×3 IMPLANT
CANISTER SUCT 3000ML PPV (MISCELLANEOUS) ×3 IMPLANT
CARTRIDGE OIL MAESTRO DRILL (MISCELLANEOUS) ×1 IMPLANT
CLIP RANEY DISP (INSTRUMENTS) IMPLANT
COVER BACK TABLE 60X90IN (DRAPES) IMPLANT
DERMABOND ADVANCED (GAUZE/BANDAGES/DRESSINGS)
DERMABOND ADVANCED .7 DNX12 (GAUZE/BANDAGES/DRESSINGS) IMPLANT
DIFFUSER DRILL AIR PNEUMATIC (MISCELLANEOUS) ×3 IMPLANT
DRAPE NEUROLOGICAL W/INCISE (DRAPES) ×3 IMPLANT
DRAPE WARM FLUID 44X44 (DRAPE) ×3 IMPLANT
DURAPREP 6ML APPLICATOR 50/CS (WOUND CARE) ×3 IMPLANT
DURASEAL APPLICATOR TIP (TIP) ×2 IMPLANT
DURASEAL SPINE SEALANT 3ML (MISCELLANEOUS) ×2 IMPLANT
ELECT REM PT RETURN 9FT ADLT (ELECTROSURGICAL) ×3
ELECTRODE REM PT RTRN 9FT ADLT (ELECTROSURGICAL) ×1 IMPLANT
GAUZE SPONGE 4X4 12PLY STRL (GAUZE/BANDAGES/DRESSINGS) ×3 IMPLANT
GAUZE SPONGE 4X4 16PLY XRAY LF (GAUZE/BANDAGES/DRESSINGS) IMPLANT
GLOVE BIO SURGEON STRL SZ 6.5 (GLOVE) IMPLANT
GLOVE BIO SURGEON STRL SZ7 (GLOVE) IMPLANT
GLOVE BIO SURGEON STRL SZ7.5 (GLOVE) IMPLANT
GLOVE BIO SURGEON STRL SZ8 (GLOVE) ×2 IMPLANT
GLOVE BIO SURGEON STRL SZ8.5 (GLOVE) IMPLANT
GLOVE BIO SURGEONS STRL SZ 6.5 (GLOVE)
GLOVE BIOGEL M 8.0 STRL (GLOVE) IMPLANT
GLOVE BIOGEL PI IND STRL 7.0 (GLOVE) IMPLANT
GLOVE BIOGEL PI IND STRL 7.5 (GLOVE) IMPLANT
GLOVE BIOGEL PI INDICATOR 7.0 (GLOVE) ×4
GLOVE BIOGEL PI INDICATOR 7.5 (GLOVE) ×4
GLOVE ECLIPSE 6.5 STRL STRAW (GLOVE) ×3 IMPLANT
GLOVE ECLIPSE 7.0 STRL STRAW (GLOVE) IMPLANT
GLOVE ECLIPSE 7.5 STRL STRAW (GLOVE) IMPLANT
GLOVE ECLIPSE 8.0 STRL XLNG CF (GLOVE) IMPLANT
GLOVE ECLIPSE 8.5 STRL (GLOVE) IMPLANT
GLOVE EXAM NITRILE LRG STRL (GLOVE) IMPLANT
GLOVE EXAM NITRILE XL STR (GLOVE) IMPLANT
GLOVE EXAM NITRILE XS STR PU (GLOVE) IMPLANT
GLOVE INDICATOR 6.5 STRL GRN (GLOVE) IMPLANT
GLOVE INDICATOR 7.0 STRL GRN (GLOVE) IMPLANT
GLOVE INDICATOR 7.5 STRL GRN (GLOVE) IMPLANT
GLOVE INDICATOR 8.0 STRL GRN (GLOVE) IMPLANT
GLOVE INDICATOR 8.5 STRL (GLOVE) IMPLANT
GLOVE OPTIFIT SS 8.0 STRL (GLOVE) IMPLANT
GLOVE SURG SS PI 6.5 STRL IVOR (GLOVE) IMPLANT
GOWN STRL REUS W/ TWL LRG LVL3 (GOWN DISPOSABLE) ×2 IMPLANT
GOWN STRL REUS W/ TWL XL LVL3 (GOWN DISPOSABLE) IMPLANT
GOWN STRL REUS W/TWL 2XL LVL3 (GOWN DISPOSABLE) ×2 IMPLANT
GOWN STRL REUS W/TWL LRG LVL3 (GOWN DISPOSABLE) ×12
GOWN STRL REUS W/TWL XL LVL3 (GOWN DISPOSABLE)
GRAFT DURAGEN MATRIX 2WX2L ×4 IMPLANT
HEMOSTAT SURGICEL 2X14 (HEMOSTASIS) ×3 IMPLANT
HOOK DURA 1/2IN (MISCELLANEOUS) ×2 IMPLANT
KIT BASIN OR (CUSTOM PROCEDURE TRAY) ×3 IMPLANT
KIT ROOM TURNOVER OR (KITS) ×3 IMPLANT
NDL HYPO 25X1 1.5 SAFETY (NEEDLE) ×1 IMPLANT
NEEDLE HYPO 25X1 1.5 SAFETY (NEEDLE) ×3 IMPLANT
NS IRRIG 1000ML POUR BTL (IV SOLUTION) ×5 IMPLANT
OIL CARTRIDGE MAESTRO DRILL (MISCELLANEOUS) ×3
PACK CRANIOTOMY CUSTOM (CUSTOM PROCEDURE TRAY) ×3 IMPLANT
PAD ARMBOARD 7.5X6 YLW CONV (MISCELLANEOUS) ×9 IMPLANT
PEEK CRANIOPLASTY SMALL (Neuro Prosthesis/Implant) ×2 IMPLANT
PIN MAYFIELD SKULL DISP (PIN) IMPLANT
PLATE BONE 12 2H TARGET XL (Plate) ×6 IMPLANT
PLATE CRANIAL 12 2H RIGID UNI (Plate) ×2 IMPLANT
SCREW UNIII AXS SD 1.5X4 (Screw) ×16 IMPLANT
SEALANT ADHERUS EXTEND TIP (MISCELLANEOUS) ×2 IMPLANT
SET CRAINOPLASTY (SET/KITS/TRAYS/PACK) ×3 IMPLANT
SPONGE SURGIFOAM ABS GEL 100 (HEMOSTASIS) ×2 IMPLANT
STAPLER SKIN PROX WIDE 3.9 (STAPLE) ×3 IMPLANT
SUT ETHILON 3 0 FSL (SUTURE) IMPLANT
SUT NURALON 4 0 TR CR/8 (SUTURE) ×2 IMPLANT
SUT VIC AB 2-0 CT2 18 VCP726D (SUTURE) ×5 IMPLANT
SYR CONTROL 10ML LL (SYRINGE) ×1 IMPLANT
TAPE CLOTH 1X10 TAN NS (GAUZE/BANDAGES/DRESSINGS) ×2 IMPLANT
TOWEL GREEN STERILE (TOWEL DISPOSABLE) ×3 IMPLANT
TOWEL GREEN STERILE FF (TOWEL DISPOSABLE) ×3 IMPLANT
TRAY FOLEY W/METER SILVER 16FR (SET/KITS/TRAYS/PACK) ×2 IMPLANT
UNDERPAD 30X30 (UNDERPADS AND DIAPERS) IMPLANT
WATER STERILE IRR 1000ML POUR (IV SOLUTION) ×3 IMPLANT

## 2017-06-25 NOTE — Op Note (Signed)
06/25/2017  1:24 PM  PATIENT:  [name]  52 y.o. female  PRE-OPERATIVE DIAGNOSIS:  BENIGN NEOPLASM OF MENINGES, UNSPECIFIED, cranial defect, csf leak  POST-OPERATIVE DIAGNOSIS:  BENIGN NEOPLASM OF MENINGES,UNSPECIFIED, cranial defect, csf leak  PROCEDURE:  Procedure(s): CRANIOPLASTY RIGHT PTERIONAL DEFECT  SURGEON: Surgeon(s): Ashok Pall, MD Eustace Moore, MD  ASSISTANTS:Jones, Shanon Brow  ANESTHESIA:   none  EBL:  Total I/O In: 900 [I.V.:900] Out: 625 [Urine:525; Blood:100]  BLOOD ADMINISTERED:none  CELL SAVER GIVEN:none  COUNT:per nursing  DRAINS: none   SPECIMEN:  No Specimen  DICTATION: [name] was taken to the operating room, intubated, and placed under a general anesthetic without difficulty. She was positioned supine with her head on a horseshoe. Her head was shaved and was prepped and draped in a sterile manner. I infiltrated lidocaine with epinephrine into the existing incision. I opened the incision with a 10 blade and placed Raney clips along the scalp edges. I divided the temporalis with monopolar cautery, and developed the scalp flap along with the muscle rostrally. I used cautery and curettes, along with the periosteal elevators to separate the scalp from the skull. Once I had the entire bone flap exposed I removed the screws on the skull side. I was then able to elevate the skull flap to expose the dura.  Given the temporal bone defects which I believed to be the cause of the csf leak, I stripped the dura off the inner skull, and placed duragen (dural substitute) on the skull base, then used a dural sealant. The brain dura acting to apply pressure to the duragen.  I then placed the custom Peek cranial implant, using plates and screws, in the cranial defect. I secured it along with Dr. Ronnald Ramp. We irrigated copiously. I then closed the incision using vicryl sutures to approximated the temporalis muslce, and the galeal layer. I used staples on the scalp  edges. I applied a sterile dressing. [name] was extubated and brought to recovery.   PLAN OF CARE: Admit to inpatient   PATIENT DISPOSITION:  PACU - hemodynamically stable.   Delay start of Pharmacological VTE agent (>24hrs) due to surgical blood loss or risk of bleeding:  yes

## 2017-06-25 NOTE — Anesthesia Postprocedure Evaluation (Signed)
Anesthesia Post Note  Patient: [name]  Procedure(s) Performed: CRANIOPLASTY RIGHT PTERIONAL DEFECT (Right Head)     Patient location during evaluation: PACU Anesthesia Type: General Level of consciousness: awake and alert Pain management: pain level controlled Vital Signs Assessment: post-procedure vital signs reviewed and stable Respiratory status: spontaneous breathing, nonlabored ventilation, respiratory function stable and patient connected to nasal cannula oxygen Cardiovascular status: blood pressure returned to baseline and stable Postop Assessment: no apparent nausea or vomiting Anesthetic complications: no    Last Vitals:  Vitals:   06/25/17 1414 06/25/17 1415  BP: 136/88   Pulse: 62 62  Resp:  (!) 8  Temp:  36.8 C  SpO2: 100% 100%    Last Pain:  Vitals:   06/25/17 1615  TempSrc:   PainSc: 4                  Ryan P Ellender

## 2017-06-25 NOTE — H&P (Signed)
BP 122/87   Pulse 67   Temp 97.7 F (36.5 C) (Oral)   Resp 18   LMP 05/22/2017   SpO2 99%  [name] returns today. What we are going to do is go ahead and start the process. I have submitted her application for the operation. 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 Her physical appearance is extraordinarily important, and it is necessary that she portray the company in a good light. She has a significant pterional defect which is obvious upon first sight. Pterional implant will allow this area to be full and will get away from the concerns that she has about her appearance currently. It will not interfere whatsoever with future treatments.  Allergies  Allergen Reactions  . No Known Allergies   . Other     All Meat products, pt is strictly a vegetarian    No family history on file. Social History   Socioeconomic History  . Marital status: Single    Spouse name: Not on file  . Number of children: Not on file  . Years of education: Not on file  . Highest education level: Not on file  Social Needs  . Financial resource strain: Not on file  . Food insecurity - worry: Not on file  . Food insecurity - inability: Not on file  . Transportation needs - medical: Not on file  . Transportation needs - non-medical: Not on file  Occupational History  . Not on file  Tobacco Use  . Smoking status: Former Smoker    Packs/day: 0.25    Years: 4.00    Pack years: 1.00    Types: Cigarettes    Last attempt to quit: 09/18/2010    Years since quitting: 6.7  . Smokeless tobacco: Never Used  . Tobacco comment: QUIT 3 YEARS AGO-ABOUT 2008--SOCIAL SMOKER ONLY  Substance and Sexual Activity  . Alcohol use: No  . Drug use: No  . Sexual activity: Not Currently  Other Topics Concern  . Not on file  Social History Narrative  . Not on file   Past Surgical History:  Procedure Laterality Date  . CESAREAN SECTION  06/1990  . CRANIOTOMY Right 09/25/2016    Procedure: PTERIONAL CRANIOTOMY  FOR TUMOR;  Surgeon: Ashok Pall, MD;  Location: [facility];  Service: Neurosurgery;  Laterality: Right;  . CYST REMOVED BOTH HANDS  1997 OR 1998  . PARTIAL KNEE ARTHROPLASTY  06/24/2011   Procedure: UNICOMPARTMENTAL KNEE;  Surgeon: Mauri Pole, MD;  Location: [facility];  Service: Orthopedics;  Laterality: Right;  . RIGHT KNEE ARTHROSCOPY  1984   Past Medical History:  Diagnosis Date  . 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 Diabetes mellitus without complication ([place])   . Dysrhythmia    PT STATES SHE HAS AN EXTRA HEART BEAT AT TIMES--FIRST NOTICED BY HER GYN  DR. Cletis Media COUPLE OF YRS AGO--PT STATES SHE WAS SENT FOR STRESS TEST-TREADMILL.  -PT DOESN'T HAVE ANY OTHER INFORMATION.  Marland Kitchen Headache   . Hypertension    B/P ELEVATED LAST 3 DOCTOR VISITS--BUT NO PRIOR HX AND NOT ON B/P MEDS  . 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 [name] CABELL IN [place] ONCE A YEAR  . Seizures ([place])    mini seizure  1 week after surgery: pt.  wasn't aware she had on,delayed responses to communication,showed up on ct scan   Prior to Admission medications   Medication Sig Start Date End Date Taking? Authorizing Provider  benazepril (LOTENSIN) 10 MG tablet Take 10 mg by mouth daily.   Yes [provider]  Cholecalciferol (VITAMIN D PO) Take 1 capsule by mouth daily.   Yes [provider]  Cyanocobalamin (VITAMIN B 12 PO) Take 1 tablet by mouth daily.   Yes [provider]  FOLIC ACID PO Take 1 tablet by mouth daily.   Yes [provider]  levETIRAcetam (KEPPRA) 500 MG tablet Take 500 mg by mouth 2 (two) times daily.   Yes [provider]  metFORMIN (GLUCOPHAGE) 500 MG tablet Take 500 mg by mouth 2 (two) times daily with a meal.    Yes [provider]   Multiple Vitamin (MULITIVITAMIN WITH MINERALS) TABS Take 1 tablet by mouth daily.    Yes [provider]  pravastatin (PRAVACHOL) 20 MG tablet Take 20 mg by mouth daily.   Yes [provider]  sitaGLIPtin (JANUVIA) 100 MG tablet Take 100 mg by mouth daily.   Yes [provider]    On exam, she is alert, oriented x4. She answers all questions appropriately. 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 So that in addition to the pterional defect do lead her to have an appearance which is not consistent with what her company wants her to have nor with her expectations.  She also has a csf leak, due to dehiscence around the skull base after radiation to the region. I will also fix the csf leak during this operation. Risks including brain damage, persistent csf leak, bleeding, infection, need for further surgery, and other risks were discussed. She understands and wishes to proceed.

## 2017-06-25 NOTE — Anesthesia Preprocedure Evaluation (Addendum)
Anesthesia Evaluation  Patient identified by MRN, date of birth, ID band Patient awake    Reviewed: Allergy & Precautions, NPO status , Patient's Chart, lab work & pertinent test results  Airway Mallampati: II  TM Distance: >3 FB Neck ROM: Full    Dental no notable dental hx. (+) Dental Advisory Given   Pulmonary former smoker,    Pulmonary exam normal breath sounds clear to auscultation       Cardiovascular hypertension, Pt. on medications Normal cardiovascular exam Rhythm:Regular Rate:Normal  ECG: SR, rate 57   Neuro/Psych  Headaches, Seizures -, Well Controlled,  negative psych ROS   GI/Hepatic negative GI ROS, Neg liver ROS,   Endo/Other  diabetes, Poorly Controlled, Oral Hypoglycemic Agents  Renal/GU negative Renal ROS     Musculoskeletal negative musculoskeletal ROS (+)   Abdominal   Peds  Hematology HLD   Anesthesia Other Findings BENIGN NEOPLASM OF MENINGES Right eye sutured closed  Reproductive/Obstetrics hcg negative                            Anesthesia Physical  Anesthesia Plan  ASA: III  Anesthesia Plan: General   Post-op Pain Management:    Induction: Intravenous  PONV Risk Score and Plan: 3 and Dexamethasone, Ondansetron and Treatment may vary due to age or medical condition  Airway Management Planned: Oral ETT  Additional Equipment:   Intra-op Plan:   Post-operative Plan: Extubation in OR  Informed Consent: I have reviewed the patients History and Physical, chart, labs and discussed the procedure including the risks, benefits and alternatives for the proposed anesthesia with the patient or authorized representative who has indicated his/her understanding and acceptance.   Dental advisory given  Plan Discussed with: Anesthesiologist and CRNA  Anesthesia Plan Comments:        Anesthesia Quick Evaluation

## 2017-06-25 NOTE — Transfer of Care (Signed)
Immediate Anesthesia Transfer of Care Note  Patient: [name]  Procedure(s) Performed: CRANIOPLASTY RIGHT PTERIONAL DEFECT (Right Head)  Patient Location: PACU  Anesthesia Type:General  Level of Consciousness: awake, alert , oriented and patient cooperative  Airway & Oxygen Therapy: Patient Spontanous Breathing and Patient connected to nasal cannula oxygen  Post-op Assessment: Report given to RN and Post -op Vital signs reviewed and stable  Post vital signs: Reviewed and stable  Last Vitals:  Vitals:   06/25/17 0908 06/25/17 1315  BP: 122/87   Pulse: 67   Resp: 18   Temp: 36.5 C (!) (P) 36.3 C  SpO2: 99%     Last Pain:  Vitals:   06/25/17 0908  TempSrc: Oral         Complications: No apparent anesthesia complications

## 2017-06-25 NOTE — Anesthesia Procedure Notes (Signed)
Procedure Name: Intubation Date/Time: 06/25/2017 11:15 AM Performed by: Shirlyn Goltz, CRNA Pre-anesthesia Checklist: Patient identified, Emergency Drugs available, Suction available and Patient being monitored Patient Re-evaluated:Patient Re-evaluated prior to induction Oxygen Delivery Method: Circle system utilized Preoxygenation: Pre-oxygenation with 100% oxygen Induction Type: IV induction Ventilation: Mask ventilation without difficulty Laryngoscope Size: Mac and 3 Grade View: Grade I Tube type: Oral Tube size: 7.0 mm Number of attempts: 1 Airway Equipment and Method: Stylet Placement Confirmation: ETT inserted through vocal cords under direct vision,  positive ETCO2 and breath sounds checked- equal and bilateral Secured at: 21 cm Tube secured with: Tape Dental Injury: Teeth and Oropharynx as per pre-operative assessment

## 2017-06-26 MED ORDER — WHITE PETROLATUM EX OINT
TOPICAL_OINTMENT | CUTANEOUS | Status: AC
  Filled 2017-06-26: qty 28.35

## 2017-06-26 MED ORDER — PANTOPRAZOLE SODIUM 40 MG PO TBEC: 40.0000 mg | DELAYED_RELEASE_TABLET | Freq: Every day | ORAL | Status: DC

## 2017-06-26 MED FILL — Thrombin For Soln 20000 Unit: CUTANEOUS | Qty: 1 | Status: AC

## 2017-06-26 NOTE — Discharge Instructions (Signed)
Craniotomy °Care After °Please read the instructions outlined below and refer to this sheet in the next few weeks. These discharge instructions provide you with general information on caring for yourself after you leave the hospital. Your surgeon may also give you specific instructions. While your treatment has been planned according to the most current medical practices available, unavoidable complications occasionally occur. If you have any problems or questions after discharge, please call your surgeon. °Although there are many types of brain surgery, recovery following craniotomy (surgical opening of the skull) is much the same for each. However, recovery depends on many factors. These include the type and severity of brain injury and the type of surgery. It also depends on any nervous system function problems (neurological deficits) before surgery. If the craniotomy was done for cancer, chemotherapy and radiation could follow. You could be in the hospital from 5 days to a couple weeks. This depends on the type of surgery, findings, and whether there are complications. °HOME CARE INSTRUCTIONS  °· It is not unusual to hear a clicking noise after a craniotomy, the plates and screws used to attach the bone flap can sometimes cause this. It is a normal occurrence if this does happen °· Do not drive for 10 days after the operation °· Your scalp may feel spongy for a while, because of fluid under it. This will gradually get better. Occasionally, the surgeon will not replace the bone that was removed to access the brain. If there is a bony defect, the surgeon will ask you to wear a helmet for protection. This is a discussion you should have with your surgeon prior to leaving the hospital (discharge). °· Numbness may persist in some areas of your scalp. °· Take all medications as directed. Sometimes steroids to control swelling are prescribed. Anticonvulsants to prevent seizures may also be given. Do not use alcohol,  other drugs, or medications unless your surgeon says it is OK. °· Keep the wound dry and clean. The wound may be washed gently with soap and water. Then, you may gently blot or dab it dry, without rubbing. Do not take baths, use swimming pools or hot tubs for 10 days, or as instructed by your caregiver. It is best to wait to see you surgeon at your first postoperative visit, and to get directions at that time. °· Only take over-the-counter or prescription medicines for pain, discomfort, or fever as directed by your caregiver. °· You may continue your normal diet, as directed. °· Walking is OK for exercise. Wait at least 3 months before you return to mild, non-contact sports or as your surgeon suggests. Contact sports should be avoided for at least 1 year, unless your surgeon says it is OK. °· If you are prescribed steroids, take them exactly as prescribed. If you start having a decrease in nervous system functions (neurological deficits) and headaches as the dose of steroids is reduced, tell your surgeon right away. °· When the anticonvulsant prescription is finished you no longer need to take it. °SEEK IMMEDIATE MEDICAL CARE IF:  °· You develop nausea, vomiting, severe headaches, confusion, or you have a seizure. °· You develop chest pain, a stiff neck, or difficulty breathing. °· There is redness, swelling, or increasing pain in the wound or pin insertion sites. °· You have an increase in swelling or bruising around the eyes. °· There is drainage or pus coming from the wound. °· You have an oral temperature above 102° F (38.9° C), not controlled by medicine. °·   You notice a foul smell coming from the wound or dressing. °· The wound breaks open (edges not staying together) after the stitches have been removed. °· You develop dizziness or fainting while standing. °· You develop a rash. °· You develop any reaction or side effects to the medications given. °Document Released: 07/30/2005 Document Revised: 07/22/2011  Document Reviewed: 05/08/2009 °[employer]® Patient Information ©2013 [employer]. ° °

## 2017-06-26 NOTE — Discharge Summary (Signed)
Physician Discharge Summary  Patient ID: [name] MRN: [id number] DOB/AGE: 1966/03/21 52 y.o.  Admit date: 06/25/2017 Discharge date: 06/26/2017  Admission Diagnoses: cranial defect  Discharge Diagnoses:  Active Problems:   Cranial anomaly   CSF leak from ear   Discharged Condition: good  Hospital Course: [name] was admitted and taken to the operating room for a simple cranioplasty with a custom peek implant. I also attempted to repair the csf leak from the tegmen tympani. She is alert, voiding, and ambulating. She will come to office tomorrow to remove the dressing. She is otherwise at her baseline  Treatments: surgery: cranioplasty  Discharge Exam: Blood pressure 135/86, pulse 65, temperature 98.3 F (36.8 C), temperature source Oral, resp. rate 15, height 5\' 10"  (1.778 m), weight 81.6 kg (180 lb), last menstrual period 05/22/2017, SpO2 98 %. General appearance: alert, cooperative and no distress Neurologic: Alert and oriented X 3, normal strength and tone. Normal symmetric reflexes. Normal coordination and gait Cranial nerves: V: facial light touch sensation reduced on the right  Disposition: 01-Home or Self Care BENIGN NEOPLASM OF MENINGES, UNSPECIFIED  Allergies as of 06/26/2017      Reactions   No Known Allergies    Other    All Meat products, pt is strictly a vegetarian       Medication List    TAKE these medications   benazepril 10 MG tablet Commonly known as:  LOTENSIN Take 10 mg by mouth daily.   FOLIC ACID PO Take 1 tablet by mouth daily.   levETIRAcetam 500 MG tablet Commonly known as:  KEPPRA Take 500 mg by mouth 2 (two) times daily.   metFORMIN 500 MG tablet Commonly known as:  GLUCOPHAGE Take 500 mg by mouth 2 (two) times daily with a meal.   multivitamin with minerals Tabs tablet Take 1 tablet by mouth daily.   pravastatin 20 MG tablet Commonly known as:  PRAVACHOL Take 20 mg by mouth daily.   sitaGLIPtin 100 MG tablet Commonly  known as:  JANUVIA Take 100 mg by mouth daily.   VITAMIN B 12 PO Take 1 tablet by mouth daily.   VITAMIN D PO Take 1 capsule by mouth daily.      Follow-up Information    Ashok Pall, MD Follow up.   Specialty:  Neurosurgery Why:  come in tomorrow to have dressing removed. Contact information: [address] [place] [address] [phone]           Signed: Winfield Cunas 06/26/2017, 4:05 PM

## 2017-06-27 ENCOUNTER — Encounter (HOSPITAL_COMMUNITY): Payer: Self-pay | Admitting: Neurosurgery

## 2017-08-01 ENCOUNTER — Other Ambulatory Visit: Payer: Self-pay | Admitting: Neurosurgery

## 2017-08-14 NOTE — Pre-Procedure Instructions (Signed)
[name]  [birth year]      [id number] [name], [place] - [address] [place] [address] Phone: [phone] Fax: [phone]    Your procedure is scheduled on August 22, 2017  Report to [facility] Admitting at 715 AM.  Call this number if you have problems the morning of surgery:  [phone]   Remember:  Do not eat food or drink liquids after midnight.  Take these medicines the morning of surgery with A SIP OF WATER keppra (levetiracetam).  7 days prior to surgery STOP taking any Aspirin (unless otherwise instructed by your surgeon), Aleve, Naproxen, Ibuprofen, Motrin, Advil, Goody's, BC's, all herbal medications, fish oil, and all vitamins  Continue all other medications as instructed by your physician except follow the above medication instructions before surgery  WHAT DO I DO ABOUT MY DIABETES MEDICATION?  [name] Kitchen Do not take oral diabetes medicines (pills) the morning of surgery sitagliptin [name]), metformin (glucophage).  Reviewed and Endorsed by [facility] Patient Education Committee, August 2015  How to Manage Your Diabetes Before and After Surgery  Why is it important to control my blood sugar before and after surgery? . Improving blood sugar levels before and after surgery helps healing and can limit problems. . A way of improving blood sugar control is eating a healthy diet by: o  Eating less sugar and carbohydrates o  Increasing activity/exercise o  Talking with your doctor about reaching your blood sugar goals . High blood sugars (greater than 180 mg/dL) can raise your risk of infections and slow your recovery, so you will need to focus on controlling your diabetes during the weeks before surgery. . Make sure that the doctor who takes care of your diabetes knows about your planned surgery including the date and location.  How do I manage my blood sugar before surgery? . Check your  blood sugar at least 4 times a day, starting 2 days before surgery, to make sure that the level is not too high or low. o Check your blood sugar the morning of your surgery when you wake up and every 2 hours until you get to the Short Stay unit. . If your blood sugar is less than 70 mg/dL, you will need to treat for low blood sugar: o Do not take insulin. o Treat a low blood sugar (less than 70 mg/dL) with  cup of clear juice (cranberry or apple), 4 glucose tablets, OR glucose gel. Recheck blood sugar in 15 minutes after treatment (to make sure it is greater than 70 mg/dL). If your blood sugar is not greater than 70 mg/dL on recheck, call [phone] o  for further instructions. . Report your blood sugar to the short stay nurse when you get to Short Stay.  . If you are admitted to the hospital after surgery: o Your blood sugar will be checked by the staff and you will probably be given insulin after surgery (instead of oral diabetes medicines) to make sure you have good blood sugar levels. o The goal for blood sugar control after surgery is 80-180 mg/dL.    Do not wear jewelry, make-up or nail polish.  Do not wear lotions, powders, or perfumes, or deodorant.  Do not shave 48 hours prior to surgery.  Men may shave face and neck.  Do not bring valuables to the hospital.  [facility] is not responsible for any belongings or valuables.  Contacts, dentures  or bridgework may not be worn into surgery.  Leave your suitcase in the car.  After surgery it may be brought to your room.  For patients admitted to the hospital, discharge time will be determined by your treatment team.  Patients discharged the day of surgery will not be allowed to drive home.    [facility]- Preparing For Surgery  Before surgery, you can play an important role. Because skin is not sterile, your skin needs to be as free of germs as possible. You can reduce the number of germs on your skin by washing with CHG  (chlorahexidine gluconate) Soap before surgery.  CHG is an antiseptic cleaner which kills germs and bonds with the skin to continue killing germs even after washing.  Please do not use if you have an allergy to CHG or antibacterial soaps. If your skin becomes reddened/irritated stop using the CHG.  Do not shave (including legs and underarms) for at least 48 hours prior to first CHG shower. It is OK to shave your face.  Please follow these instructions carefully.   1. Shower the NIGHT BEFORE SURGERY and the MORNING OF SURGERY with CHG.   2. If you chose to wash your hair, wash your hair first as usual with your normal shampoo.  3. After you shampoo, rinse your hair and body thoroughly to remove the shampoo.  4. Use CHG as you would any other liquid soap. You can apply CHG directly to the skin and wash gently with a scrungie or a clean washcloth.   5. Apply the CHG Soap to your body ONLY FROM THE NECK DOWN.  Do not use on open wounds or open sores. Avoid contact with your eyes, ears, mouth and genitals (private parts). Wash Face and genitals (private parts)  with your normal soap.  6. Wash thoroughly, paying special attention to the area where your surgery will be performed.  7. Thoroughly rinse your body with warm water from the neck down.  8. DO NOT shower/wash with your normal soap after using and rinsing off the CHG Soap.  9. Pat yourself dry with a CLEAN TOWEL.  10. Wear CLEAN PAJAMAS to bed the night before surgery, wear comfortable clothes the morning of surgery  11. Place CLEAN SHEETS on your bed the night of your first shower and DO NOT SLEEP WITH PETS.  Day of Surgery: Do not apply any deodorants/lotions. Please wear clean clothes to the hospital/surgery center.     Please read over the following fact sheets that you were given. Pain Booklet, Coughing and Deep Breathing and Surgical Site Infection Prevention

## 2017-08-15 ENCOUNTER — Other Ambulatory Visit: Payer: Self-pay

## 2017-08-15 ENCOUNTER — Encounter (HOSPITAL_COMMUNITY): Payer: Self-pay

## 2017-08-15 ENCOUNTER — Encounter (HOSPITAL_COMMUNITY)
Admission: RE | Admit: 2017-08-15 | Discharge: 2017-08-15 | Disposition: A | Discharge status: Home / Self Care | Payer: BLUE CROSS/BLUE SHIELD | Source: Ambulatory Visit | Attending: Neurosurgery | Admitting: Neurosurgery

## 2017-08-15 DIAGNOSIS — Z87891 Personal history of nicotine dependence: Secondary | ICD-10-CM | POA: Insufficient documentation

## 2017-08-15 DIAGNOSIS — Z79899 Other long term (current) drug therapy: Secondary | ICD-10-CM | POA: Insufficient documentation

## 2017-08-15 DIAGNOSIS — Z7984 Long term (current) use of oral hypoglycemic drugs: Secondary | ICD-10-CM | POA: Insufficient documentation

## 2017-08-15 DIAGNOSIS — Z01818 Encounter for other preprocedural examination: Secondary | ICD-10-CM | POA: Insufficient documentation

## 2017-08-15 DIAGNOSIS — I493 Ventricular premature depolarization: Secondary | ICD-10-CM | POA: Diagnosis not present

## 2017-08-15 DIAGNOSIS — Z0183 Encounter for blood typing: Secondary | ICD-10-CM | POA: Insufficient documentation

## 2017-08-15 DIAGNOSIS — Z01812 Encounter for preprocedural laboratory examination: Secondary | ICD-10-CM | POA: Insufficient documentation

## 2017-08-15 DIAGNOSIS — E119 Type 2 diabetes mellitus without complications: Secondary | ICD-10-CM | POA: Insufficient documentation

## 2017-08-15 DIAGNOSIS — I10 Essential (primary) hypertension: Secondary | ICD-10-CM | POA: Diagnosis not present

## 2017-08-15 LAB — BASIC METABOLIC PANEL
Anion gap: 7 (ref 5–15)
BUN: 6 mg/dL (ref 6–20)
CALCIUM: 9.1 mg/dL (ref 8.9–10.3)
CO2: 25 mmol/L (ref 22–32)
Chloride: 105 mmol/L (ref 101–111)
Creatinine, Ser: 0.92 mg/dL (ref 0.44–1.00)
GFR calc non Af Amer: >60 mL/min (ref >60)
Glucose, Bld: 82 mg/dL (ref 65–99)
Potassium: 4.1 mmol/L (ref 3.5–5.1)
SODIUM: 137 mmol/L (ref 135–145)

## 2017-08-15 LAB — GLUCOSE, CAPILLARY: Glucose-Capillary: 81 mg/dL (ref 65–99)

## 2017-08-15 LAB — CBC
HCT: 42.4 % (ref 36.0–46.0)
Hemoglobin: 13.1 g/dL (ref 12.0–15.0)
MCH: 26.9 pg (ref 26.0–34.0)
MCHC: 30.9 g/dL (ref 30.0–36.0)
MCV: 87.1 fL (ref 78.0–100.0)
PLATELETS: 279 10*3/uL (ref 150–400)
RBC: 4.87 MIL/uL (ref 3.87–5.11)
RDW: 13.9 % (ref 11.5–15.5)
WBC: 4.5 10*3/uL (ref 4.0–10.5)

## 2017-08-15 LAB — TYPE AND SCREEN
ABO/RH(D): A POS
Antibody Screen: NEGATIVE

## 2017-08-15 LAB — HEMOGLOBIN A1C
HEMOGLOBIN A1C: 6.1 % — AB (ref 4.8–5.6)
Mean Plasma Glucose: 128.37 mg/dL

## 2017-08-15 LAB — SURGICAL PCR SCREEN
MRSA, PCR: NEGATIVE
Staphylococcus aureus: NEGATIVE

## 2017-08-15 NOTE — Progress Notes (Signed)
PCP - Dr. Elisabeth Pigeon Endocrinologist - Dr. Criss Rosales Cardiologist - [facility]  Chest x-ray - n/a EKG - 10/06/2016 Stress Test - patient denies ECHO - 2016 Cardiac Cath - patient deneis  Sleep Study - patient denies  Fasting Blood Sugar - 90-100 Checks Blood Sugar 2 times a day  Blood Thinner Instructions: n/a Aspirin Instructions:n/a  Anesthesia review: n/a  Patient denies shortness of breath, fever, cough and chest pain at PAT appointment   Patient verbalized understanding of instructions that were given to them at the PAT appointment. Patient was also instructed that they will need to review over the PAT instructions again at home before surgery.

## 2017-08-18 NOTE — Progress Notes (Signed)
Anesthesia Chart Review:  Pt is a 52 year old female scheduled for cranioplasty 08/22/2017 with Ashok Pall, M.D.  - PCP is Elisabeth Pigeon, NP  PMH includes:HTN, PVCs, DM.Former smoker. BMI 27.S/p R cranioplasty R pterional defect 06/25/17. S/p craniotomy 09/25/16. S/p R unicompartmental knee 06/24/11.   BP (!) 138/94   Pulse 76   Temp 36.7 C   Resp 20   Ht 5\' 10"  (1.778 m)   Wt 187 lb 9.6 oz (85.1 kg)   SpO2 100%   BMI 26.92 kg/m     Medications include: Benazepril,keppra, metformin, pravastatin, sitagliptin.  Preoperative labs reviewed. HbA1c 9.7, glucose 156.  EKG 10/03/16: Sinus rhythm. Abnormal R-wave progression, early transition. Baseline wander in lead(s) II III aVF  Echo 09/20/14 (done for heart murmur at [facility]; found in correspondence 09/29/16 in media tab): 1. LA is dilated. 2. LV is mildly dilated. Mild LVH. Normal LV contraction. EF 60%. 3. No OSA M seen. 4. Mild MR. 5. TAPSE: 23.5  Pt tolerated similar surgery 2 months ago.  If no changes, I anticipate pt can proceed with surgery as scheduled.   Willeen Cass, FNP-BC [facility]/Anesthesiology Phone: [phone] 08/18/2017 3:49 PM

## 2017-08-21 NOTE — Anesthesia Preprocedure Evaluation (Addendum)
Anesthesia Evaluation  Patient identified by MRN, date of birth, ID band Patient awake    Reviewed: Allergy & Precautions, H&P , NPO status , Patient's Chart, lab work & pertinent test results  Airway Mallampati: II  TM Distance: >3 FB Neck ROM: Full    Dental no notable dental hx. (+) Teeth Intact, Dental Advisory Given   Pulmonary neg pulmonary ROS, former smoker,    Pulmonary exam normal breath sounds clear to auscultation       Cardiovascular Exercise Tolerance: Good hypertension, Pt. on medications negative cardio ROS   Rhythm:Regular Rate:Normal     Neuro/Psych  Headaches, Seizures -, Well Controlled,  negative neurological ROS  negative psych ROS   GI/Hepatic negative GI ROS, Neg liver ROS,   Endo/Other  negative endocrine ROSdiabetes, Type 2, Oral Hypoglycemic Agents  Renal/GU negative Renal ROS  negative genitourinary   Musculoskeletal  (+) Arthritis , Osteoarthritis,    Abdominal   Peds  Hematology negative hematology ROS (+)   Anesthesia Other Findings   Reproductive/Obstetrics negative OB ROS                            Anesthesia Physical Anesthesia Plan  ASA: III  Anesthesia Plan: General   Post-op Pain Management:    Induction: Intravenous  PONV Risk Score and Plan: 4 or greater and Ondansetron, Dexamethasone and Midazolam  Airway Management Planned: Oral ETT  Additional Equipment:   Intra-op Plan:   Post-operative Plan: Extubation in OR  Informed Consent: I have reviewed the patients History and Physical, chart, labs and discussed the procedure including the risks, benefits and alternatives for the proposed anesthesia with the patient or authorized representative who has indicated his/her understanding and acceptance.   Dental advisory given  Plan Discussed with: CRNA  Anesthesia Plan Comments:        Anesthesia Quick Evaluation

## 2017-08-22 ENCOUNTER — Inpatient Hospital Stay (HOSPITAL_COMMUNITY)
Admission: RE | Admit: 2017-08-22 | Discharge: 2017-08-23 | DRG: 027 | Disposition: A | Discharge status: Home / Self Care | Payer: BLUE CROSS/BLUE SHIELD | Source: Ambulatory Visit | Attending: Neurosurgery | Admitting: Neurosurgery

## 2017-08-22 ENCOUNTER — Encounter (HOSPITAL_COMMUNITY): Payer: Self-pay | Admitting: Anesthesiology

## 2017-08-22 ENCOUNTER — Inpatient Hospital Stay (HOSPITAL_COMMUNITY): Payer: BLUE CROSS/BLUE SHIELD | Admitting: Certified Registered Nurse Anesthetist

## 2017-08-22 ENCOUNTER — Inpatient Hospital Stay (HOSPITAL_COMMUNITY)
Admission: RE | Disposition: A | Discharge status: Home / Self Care | Payer: Self-pay | Source: Ambulatory Visit | Attending: Neurosurgery

## 2017-08-22 ENCOUNTER — Inpatient Hospital Stay (HOSPITAL_COMMUNITY): Payer: BLUE CROSS/BLUE SHIELD | Admitting: Emergency Medicine

## 2017-08-22 DIAGNOSIS — I10 Essential (primary) hypertension: Secondary | ICD-10-CM | POA: Diagnosis present

## 2017-08-22 DIAGNOSIS — Z791 Long term (current) use of non-steroidal anti-inflammatories (NSAID): Secondary | ICD-10-CM

## 2017-08-22 DIAGNOSIS — Z91018 Allergy to other foods: Secondary | ICD-10-CM | POA: Diagnosis not present

## 2017-08-22 DIAGNOSIS — M170 Bilateral primary osteoarthritis of knee: Secondary | ICD-10-CM | POA: Diagnosis present

## 2017-08-22 DIAGNOSIS — G40909 Epilepsy, unspecified, not intractable, without status epilepticus: Secondary | ICD-10-CM | POA: Diagnosis present

## 2017-08-22 DIAGNOSIS — D329 Benign neoplasm of meninges, unspecified: Secondary | ICD-10-CM | POA: Diagnosis present

## 2017-08-22 DIAGNOSIS — E119 Type 2 diabetes mellitus without complications: Secondary | ICD-10-CM | POA: Diagnosis present

## 2017-08-22 DIAGNOSIS — Z79899 Other long term (current) drug therapy: Secondary | ICD-10-CM

## 2017-08-22 DIAGNOSIS — Z8679 Personal history of other diseases of the circulatory system: Secondary | ICD-10-CM | POA: Diagnosis not present

## 2017-08-22 DIAGNOSIS — Z7984 Long term (current) use of oral hypoglycemic drugs: Secondary | ICD-10-CM

## 2017-08-22 DIAGNOSIS — R51 Headache: Secondary | ICD-10-CM | POA: Diagnosis present

## 2017-08-22 HISTORY — PX: CRANIOPLASTY: SHX1407

## 2017-08-22 LAB — GLUCOSE, CAPILLARY
GLUCOSE-CAPILLARY: 94 mg/dL (ref 65–99)
Glucose-Capillary: 116 mg/dL — ABNORMAL HIGH (ref 65–99)

## 2017-08-22 LAB — POCT PREGNANCY, URINE: Preg Test, Ur: NEGATIVE

## 2017-08-22 LAB — MRSA PCR SCREENING: MRSA BY PCR: NEGATIVE

## 2017-08-22 SURGERY — CRANIOPLASTY
Anesthesia: General | Site: Head

## 2017-08-22 MED ORDER — MORPHINE SULFATE (PF) 4 MG/ML IV SOLN
1.0000 mg | INTRAVENOUS | Status: DC | PRN
  Administered 2017-08-22: 2 mg via INTRAVENOUS
  Filled 2017-08-22: qty 1

## 2017-08-22 MED ORDER — POTASSIUM CHLORIDE IN NACL 20-0.9 MEQ/L-% IV SOLN
INTRAVENOUS | Status: DC
  Administered 2017-08-22: 15:00:00 via INTRAVENOUS
  Filled 2017-08-22 (×2): qty 1000

## 2017-08-22 MED ORDER — ROCURONIUM BROMIDE 10 MG/ML (PF) SYRINGE
PREFILLED_SYRINGE | INTRAVENOUS | Status: AC
  Filled 2017-08-22: qty 5

## 2017-08-22 MED ORDER — LACTATED RINGERS IV SOLN
Freq: Once | INTRAVENOUS | Status: AC
  Administered 2017-08-22: 08:00:00 via INTRAVENOUS

## 2017-08-22 MED ORDER — FENTANYL CITRATE (PF) 250 MCG/5ML IJ SOLN
INTRAMUSCULAR | Status: AC
  Filled 2017-08-22: qty 5

## 2017-08-22 MED ORDER — LIDOCAINE 2% (20 MG/ML) 5 ML SYRINGE
INTRAMUSCULAR | Status: AC
  Filled 2017-08-22: qty 5

## 2017-08-22 MED ORDER — MICROFIBRILLAR COLL HEMOSTAT EX PADS
MEDICATED_PAD | CUTANEOUS | Status: DC | PRN
  Administered 2017-08-22: 1 via TOPICAL

## 2017-08-22 MED ORDER — HYDROCODONE-ACETAMINOPHEN 5-325 MG PO TABS: 1.0000 | ORAL_TABLET | ORAL | Status: DC | PRN

## 2017-08-22 MED ORDER — ROCURONIUM BROMIDE 100 MG/10ML IV SOLN
INTRAVENOUS | Status: DC | PRN
  Administered 2017-08-22: 50 mg via INTRAVENOUS
  Administered 2017-08-22: 20 mg via INTRAVENOUS

## 2017-08-22 MED ORDER — CEFAZOLIN SODIUM-DEXTROSE 2-4 GM/100ML-% IV SOLN
2.0000 g | INTRAVENOUS | Status: AC
  Administered 2017-08-22: 2 g via INTRAVENOUS
  Filled 2017-08-22: qty 100

## 2017-08-22 MED ORDER — DEXTROSE 5 % IV SOLN
INTRAVENOUS | Status: DC | PRN
  Administered 2017-08-22: 15 ug/min via INTRAVENOUS

## 2017-08-22 MED ORDER — LIDOCAINE HCL (CARDIAC) 20 MG/ML IV SOLN
INTRAVENOUS | Status: DC | PRN
  Administered 2017-08-22: 60 mg via INTRAVENOUS

## 2017-08-22 MED ORDER — CEFAZOLIN SODIUM-DEXTROSE 2-4 GM/100ML-% IV SOLN
2.0000 g | Freq: Three times a day (TID) | INTRAVENOUS | Status: AC
  Administered 2017-08-22 – 2017-08-23 (×2): 2 g via INTRAVENOUS
  Filled 2017-08-22 (×2): qty 100

## 2017-08-22 MED ORDER — ACETAMINOPHEN 325 MG PO TABS: 650.0000 mg | ORAL_TABLET | ORAL | Status: DC | PRN

## 2017-08-22 MED ORDER — LABETALOL HCL 5 MG/ML IV SOLN: 10.0000 mg | INTRAVENOUS | Status: DC | PRN

## 2017-08-22 MED ORDER — DEXAMETHASONE SODIUM PHOSPHATE 10 MG/ML IJ SOLN
INTRAMUSCULAR | Status: DC | PRN
  Administered 2017-08-22: 10 mg via INTRAVENOUS

## 2017-08-22 MED ORDER — CYANOCOBALAMIN 250 MCG PO TABS
250.0000 ug | ORAL_TABLET | Freq: Every day | ORAL | Status: DC
  Filled 2017-08-22 (×2): qty 1

## 2017-08-22 MED ORDER — PROMETHAZINE HCL 25 MG PO TABS: 12.5000 mg | ORAL_TABLET | ORAL | Status: DC | PRN

## 2017-08-22 MED ORDER — DEXAMETHASONE SODIUM PHOSPHATE 10 MG/ML IJ SOLN
INTRAMUSCULAR | Status: AC
  Filled 2017-08-22: qty 1

## 2017-08-22 MED ORDER — ONDANSETRON HCL 4 MG/2ML IJ SOLN
INTRAMUSCULAR | Status: DC | PRN
  Administered 2017-08-22: 4 mg via INTRAVENOUS

## 2017-08-22 MED ORDER — ADULT MULTIVITAMIN W/MINERALS CH
1.0000 | ORAL_TABLET | Freq: Every day | ORAL | Status: DC
  Filled 2017-08-22: qty 1

## 2017-08-22 MED ORDER — METFORMIN HCL ER 500 MG PO TB24
500.0000 mg | ORAL_TABLET | Freq: Two times a day (BID) | ORAL | Status: DC
  Administered 2017-08-22 – 2017-08-23 (×2): 500 mg via ORAL
  Filled 2017-08-22 (×2): qty 1

## 2017-08-22 MED ORDER — CHLORHEXIDINE GLUCONATE CLOTH 2 % EX PADS: 6.0000 | MEDICATED_PAD | Freq: Once | CUTANEOUS | Status: DC

## 2017-08-22 MED ORDER — LEVETIRACETAM 500 MG PO TABS
500.0000 mg | ORAL_TABLET | Freq: Two times a day (BID) | ORAL | Status: DC
  Administered 2017-08-22 – 2017-08-23 (×2): 500 mg via ORAL
  Filled 2017-08-22 (×2): qty 1

## 2017-08-22 MED ORDER — ARTIFICIAL TEARS OPHTHALMIC OINT
TOPICAL_OINTMENT | OPHTHALMIC | Status: DC | PRN
  Administered 2017-08-22: 1 via OPHTHALMIC

## 2017-08-22 MED ORDER — HYDROMORPHONE HCL 1 MG/ML IJ SOLN: 0.2500 mg | INTRAMUSCULAR | Status: DC | PRN

## 2017-08-22 MED ORDER — PANTOPRAZOLE SODIUM 40 MG IV SOLR
40.0000 mg | Freq: Every day | INTRAVENOUS | Status: DC
  Filled 2017-08-22: qty 40

## 2017-08-22 MED ORDER — ONDANSETRON HCL 4 MG/2ML IJ SOLN: 4.0000 mg | INTRAMUSCULAR | Status: DC | PRN

## 2017-08-22 MED ORDER — BENAZEPRIL HCL 20 MG PO TABS
10.0000 mg | ORAL_TABLET | Freq: Every day | ORAL | Status: DC
  Administered 2017-08-22 – 2017-08-23 (×2): 10 mg via ORAL
  Filled 2017-08-22 (×2): qty 1

## 2017-08-22 MED ORDER — LACTATED RINGERS IV SOLN
INTRAVENOUS | Status: DC | PRN
  Administered 2017-08-22: 09:00:00 via INTRAVENOUS

## 2017-08-22 MED ORDER — ARTIFICIAL TEARS OPHTHALMIC OINT
TOPICAL_OINTMENT | OPHTHALMIC | Status: AC
  Filled 2017-08-22: qty 7

## 2017-08-22 MED ORDER — BACITRACIN ZINC 500 UNIT/GM EX OINT
TOPICAL_OINTMENT | CUTANEOUS | Status: DC | PRN
  Administered 2017-08-22: 1 via TOPICAL

## 2017-08-22 MED ORDER — 0.9 % SODIUM CHLORIDE (POUR BTL) OPTIME
TOPICAL | Status: DC | PRN
  Administered 2017-08-22 (×2): 1000 mL

## 2017-08-22 MED ORDER — FENTANYL CITRATE (PF) 100 MCG/2ML IJ SOLN
INTRAMUSCULAR | Status: DC | PRN
  Administered 2017-08-22: 50 ug via INTRAVENOUS
  Administered 2017-08-22: 100 ug via INTRAVENOUS
  Administered 2017-08-22: 50 ug via INTRAVENOUS
  Administered 2017-08-22: 100 ug via INTRAVENOUS

## 2017-08-22 MED ORDER — THROMBIN 20000 UNITS EX SOLR
CUTANEOUS | Status: AC
  Filled 2017-08-22: qty 20000

## 2017-08-22 MED ORDER — LINAGLIPTIN 5 MG PO TABS
5.0000 mg | ORAL_TABLET | Freq: Every day | ORAL | Status: DC
  Filled 2017-08-22 (×2): qty 1

## 2017-08-22 MED ORDER — LIDOCAINE-EPINEPHRINE 0.5 %-1:200000 IJ SOLN
INTRAMUSCULAR | Status: DC | PRN
  Administered 2017-08-22: 8 mL

## 2017-08-22 MED ORDER — LIDOCAINE-EPINEPHRINE 0.5 %-1:200000 IJ SOLN
INTRAMUSCULAR | Status: AC
  Filled 2017-08-22: qty 1

## 2017-08-22 MED ORDER — ACETAMINOPHEN 650 MG RE SUPP: 650.0000 mg | RECTAL | Status: DC | PRN

## 2017-08-22 MED ORDER — NALOXONE HCL 0.4 MG/ML IJ SOLN: 0.0800 mg | INTRAMUSCULAR | Status: DC | PRN

## 2017-08-22 MED ORDER — MIDAZOLAM HCL 2 MG/2ML IJ SOLN
INTRAMUSCULAR | Status: AC
  Filled 2017-08-22: qty 2

## 2017-08-22 MED ORDER — CHOLECALCIFEROL 10 MCG (400 UNIT) PO TABS
400.0000 [IU] | ORAL_TABLET | Freq: Every day | ORAL | Status: DC
  Filled 2017-08-22 (×2): qty 1

## 2017-08-22 MED ORDER — ONDANSETRON HCL 4 MG PO TABS: 4.0000 mg | ORAL_TABLET | ORAL | Status: DC | PRN

## 2017-08-22 MED ORDER — MIDAZOLAM HCL 5 MG/5ML IJ SOLN
INTRAMUSCULAR | Status: DC | PRN
  Administered 2017-08-22: 2 mg via INTRAVENOUS

## 2017-08-22 MED ORDER — PROPOFOL 10 MG/ML IV BOLUS
INTRAVENOUS | Status: AC
  Filled 2017-08-22: qty 20

## 2017-08-22 MED ORDER — THROMBIN (RECOMBINANT) 20000 UNITS EX SOLR
CUTANEOUS | Status: DC | PRN
  Administered 2017-08-22: 20 mL via TOPICAL

## 2017-08-22 MED ORDER — PROPOFOL 10 MG/ML IV BOLUS
INTRAVENOUS | Status: DC | PRN
  Administered 2017-08-22: 200 mg via INTRAVENOUS

## 2017-08-22 MED ORDER — ONDANSETRON HCL 4 MG/2ML IJ SOLN
INTRAMUSCULAR | Status: AC
  Filled 2017-08-22: qty 2

## 2017-08-22 MED ORDER — NEOSTIGMINE METHYLSULFATE 5 MG/5ML IV SOSY
PREFILLED_SYRINGE | INTRAVENOUS | Status: AC
  Filled 2017-08-22: qty 5

## 2017-08-22 MED ORDER — PRAVASTATIN SODIUM 20 MG PO TABS
20.0000 mg | ORAL_TABLET | Freq: Every day | ORAL | Status: DC
  Administered 2017-08-22: 20 mg via ORAL
  Filled 2017-08-22: qty 1

## 2017-08-22 MED ORDER — SUGAMMADEX SODIUM 500 MG/5ML IV SOLN
INTRAVENOUS | Status: DC | PRN
  Administered 2017-08-22: 100 mg via INTRAVENOUS
  Administered 2017-08-22: 400 mg via INTRAVENOUS

## 2017-08-22 SURGICAL SUPPLY — 79 items
ADH SKN CLS APL DERMABOND .7 (GAUZE/BANDAGES/DRESSINGS)
BANDAGE GAUZE 4  KLING STR (GAUZE/BANDAGES/DRESSINGS) IMPLANT
BLADE CLIPPER SURG (BLADE) IMPLANT
BNDG CMPR 75X41 PLY ABS (GAUZE/BANDAGES/DRESSINGS) ×1
BNDG GAUZE ELAST 4 BULKY (GAUZE/BANDAGES/DRESSINGS) ×2 IMPLANT
BNDG STRETCH 4X75 NS LF (GAUZE/BANDAGES/DRESSINGS) ×2 IMPLANT
CABLE BIPOLOR RESECTION CORD (MISCELLANEOUS) ×1 IMPLANT
CANISTER SUCT 3000ML PPV (MISCELLANEOUS) ×3 IMPLANT
CARTRIDGE OIL MAESTRO DRILL (MISCELLANEOUS) ×1 IMPLANT
CLIP RANEY DISP (INSTRUMENTS) IMPLANT
COVER BACK TABLE 60X90IN (DRAPES) IMPLANT
DECANTER SPIKE VIAL GLASS SM (MISCELLANEOUS) ×3 IMPLANT
DERMABOND ADVANCED (GAUZE/BANDAGES/DRESSINGS)
DERMABOND ADVANCED .7 DNX12 (GAUZE/BANDAGES/DRESSINGS) IMPLANT
DIFFUSER DRILL AIR PNEUMATIC (MISCELLANEOUS) ×1 IMPLANT
DRAPE NEUROLOGICAL W/INCISE (DRAPES) ×3 IMPLANT
DRAPE WARM FLUID 44X44 (DRAPE) ×3 IMPLANT
DURAPREP 6ML APPLICATOR 50/CS (WOUND CARE) ×3 IMPLANT
ELECT REM PT RETURN 9FT ADLT (ELECTROSURGICAL) ×3
ELECTRODE REM PT RTRN 9FT ADLT (ELECTROSURGICAL) ×1 IMPLANT
GAUZE SPONGE 4X4 12PLY STRL (GAUZE/BANDAGES/DRESSINGS) ×1 IMPLANT
GAUZE SPONGE 4X4 16PLY XRAY LF (GAUZE/BANDAGES/DRESSINGS) IMPLANT
GLOVE BIO SURGEON STRL SZ 6.5 (GLOVE) IMPLANT
GLOVE BIO SURGEON STRL SZ7 (GLOVE) IMPLANT
GLOVE BIO SURGEON STRL SZ7.5 (GLOVE) IMPLANT
GLOVE BIO SURGEON STRL SZ8 (GLOVE) IMPLANT
GLOVE BIO SURGEON STRL SZ8.5 (GLOVE) IMPLANT
GLOVE BIO SURGEONS STRL SZ 6.5 (GLOVE)
GLOVE BIOGEL M 8.0 STRL (GLOVE) IMPLANT
GLOVE BIOGEL PI IND STRL 7.5 (GLOVE) IMPLANT
GLOVE BIOGEL PI IND STRL 8 (GLOVE) IMPLANT
GLOVE BIOGEL PI INDICATOR 7.5 (GLOVE) ×2
GLOVE BIOGEL PI INDICATOR 8 (GLOVE) ×8
GLOVE ECLIPSE 6.5 STRL STRAW (GLOVE) ×3 IMPLANT
GLOVE ECLIPSE 7.0 STRL STRAW (GLOVE) ×2 IMPLANT
GLOVE ECLIPSE 7.5 STRL STRAW (GLOVE) ×6 IMPLANT
GLOVE ECLIPSE 8.0 STRL XLNG CF (GLOVE) IMPLANT
GLOVE ECLIPSE 8.5 STRL (GLOVE) IMPLANT
GLOVE EXAM NITRILE LRG STRL (GLOVE) IMPLANT
GLOVE EXAM NITRILE XL STR (GLOVE) IMPLANT
GLOVE EXAM NITRILE XS STR PU (GLOVE) IMPLANT
GLOVE INDICATOR 6.5 STRL GRN (GLOVE) IMPLANT
GLOVE INDICATOR 7.0 STRL GRN (GLOVE) IMPLANT
GLOVE INDICATOR 7.5 STRL GRN (GLOVE) IMPLANT
GLOVE INDICATOR 8.0 STRL GRN (GLOVE) IMPLANT
GLOVE INDICATOR 8.5 STRL (GLOVE) IMPLANT
GLOVE OPTIFIT SS 8.0 STRL (GLOVE) IMPLANT
GLOVE SURG SS PI 6.5 STRL IVOR (GLOVE) IMPLANT
GOWN STRL REUS W/ TWL LRG LVL3 (GOWN DISPOSABLE) ×2 IMPLANT
GOWN STRL REUS W/ TWL XL LVL3 (GOWN DISPOSABLE) IMPLANT
GOWN STRL REUS W/TWL 2XL LVL3 (GOWN DISPOSABLE) ×4 IMPLANT
GOWN STRL REUS W/TWL LRG LVL3 (GOWN DISPOSABLE) ×3
GOWN STRL REUS W/TWL XL LVL3 (GOWN DISPOSABLE)
HEMOSTAT SURGICEL 2X14 (HEMOSTASIS) ×3 IMPLANT
IMPL MEDPOR CUSTOMIZ-PLUS MED (Head) IMPLANT
KIT BASIN OR (CUSTOM PROCEDURE TRAY) ×3 IMPLANT
KIT TURNOVER KIT B (KITS) ×3 IMPLANT
MEDPOR CUSTOMIZED PLUS MEDIUM (Head) ×2 IMPLANT
NDL HYPO 25X1 1.5 SAFETY (NEEDLE) ×1 IMPLANT
NEEDLE HYPO 25X1 1.5 SAFETY (NEEDLE) ×3 IMPLANT
NS IRRIG 1000ML POUR BTL (IV SOLUTION) ×5 IMPLANT
OIL CARTRIDGE MAESTRO DRILL (MISCELLANEOUS)
PACK CRANIOTOMY CUSTOM (CUSTOM PROCEDURE TRAY) ×3 IMPLANT
PAD ARMBOARD 7.5X6 YLW CONV (MISCELLANEOUS) ×9 IMPLANT
PIN MAYFIELD SKULL DISP (PIN) IMPLANT
PLATE BONE 12 2H TARGET XL (Plate) ×4 IMPLANT
SCREW UNIII AXS SD 1.5X4 (Screw) ×8 IMPLANT
SET CRAINOPLASTY (SET/KITS/TRAYS/PACK) ×3 IMPLANT
SPONGE SURGIFOAM ABS GEL 100 (HEMOSTASIS) ×2 IMPLANT
STAPLER SKIN PROX WIDE 3.9 (STAPLE) ×3 IMPLANT
SUT ETHILON 3 0 FSL (SUTURE) IMPLANT
SUT NURALON 4 0 TR CR/8 (SUTURE) IMPLANT
SUT VIC AB 2-0 CT2 18 VCP726D (SUTURE) ×3 IMPLANT
SYR CONTROL 10ML LL (SYRINGE) ×1 IMPLANT
TOWEL GREEN STERILE (TOWEL DISPOSABLE) ×3 IMPLANT
TOWEL GREEN STERILE FF (TOWEL DISPOSABLE) ×3 IMPLANT
TRAY FOLEY W/METER SILVER 16FR (SET/KITS/TRAYS/PACK) IMPLANT
UNDERPAD 30X30 (UNDERPADS AND DIAPERS) IMPLANT
WATER STERILE IRR 1000ML POUR (IV SOLUTION) ×3 IMPLANT

## 2017-08-22 NOTE — H&P (Addendum)
BP (!) 138/94   Pulse 69   Temp 98.4 F (36.9 C) (Oral)   Resp 20   Ht 5\' 10"  (1.778 m)   Wt 84.8 kg (187 lb)   SpO2 100%   BMI 26.83 kg/m  [name] returns for a cranioplasty.  Allergies  Allergen Reactions  . No Known Allergies   . Other     All Meat products, pt is strictly a vegetarian    Past Medical History:  Diagnosis Date  . 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 Diabetes mellitus without complication ([place])   . Dysrhythmia    PT STATES SHE HAS AN EXTRA HEART BEAT AT TIMES--FIRST NOTICED BY HER GYN  DR. Cletis Media COUPLE OF YRS AGO--PT STATES SHE WAS SENT FOR STRESS TEST-TREADMILL.  -PT DOESN'T HAVE ANY OTHER INFORMATION.  Marland Kitchen Headache   . Hypertension    B/P ELEVATED LAST 3 DOCTOR VISITS--BUT NO PRIOR HX AND NOT ON B/P MEDS  . 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 [name] CABELL IN [place] ONCE A YEAR  . Seizures ([place])    mini seizure  1 week after surgery: pt. wasn't aware she had on,delayed responses to communication,showed up on ct scan   Past Surgical History:  Procedure Laterality Date  . CESAREAN SECTION  06/1990  . CRANIOPLASTY Right 06/25/2017   Procedure: CRANIOPLASTY RIGHT PTERIONAL DEFECT;  Surgeon: Ashok Pall, MD;  Location: [place];  Service: Neurosurgery;  Laterality: Right;  right  . CRANIOTOMY Right 09/25/2016   Procedure: PTERIONAL CRANIOTOMY  FOR TUMOR;  Surgeon: Ashok Pall, MD;  Location: [place];  Service: Neurosurgery;  Laterality: Right;  . CYST REMOVED BOTH HANDS  1997 OR 1998  . PARTIAL KNEE ARTHROPLASTY  06/24/2011   Procedure: UNICOMPARTMENTAL KNEE;  Surgeon: Mauri Pole, MD;  Location: [facility];  Service: Orthopedics;  Laterality: Right;  . RIGHT KNEE ARTHROSCOPY  1984   History reviewed. No pertinent family history. Social History   Socioeconomic History  .  Marital status: Single    Spouse name: Not on file  . Number of children: Not on file  . Years of education: Not on file  . Highest education level: Not on file  Occupational History  . Not on file  Social Needs  . Financial resource strain: Not on file  . Food insecurity:    Worry: Not on file    Inability: Not on file  . Transportation needs:    Medical: Not on file    Non-medical: Not on file  Tobacco Use  . Smoking status: Former Smoker    Packs/day: 0.25    Years: 4.00    Pack years: 1.00    Types: Cigarettes    Last attempt to quit: 09/18/2010    Years since quitting: 6.9  . Smokeless tobacco: Never Used  . Tobacco comment: QUIT 3 YEARS AGO-ABOUT 2008--SOCIAL SMOKER ONLY  Substance and Sexual Activity  . Alcohol use: No  . Drug use: No  . Sexual activity: Not Currently  Lifestyle  . Physical activity:    Days per week: Not on file    Minutes per session: Not on file  . Stress: Not on file  Relationships  . Social connections:    Talks on phone: Not on file    Gets together: Not on file  Attends religious service: Not on file    Active member of club or organization: Not on file    Attends meetings of clubs or organizations: Not on file    Relationship status: Not on file  . Intimate partner violence:    Fear of current or ex partner: Not on file    Emotionally abused: Not on file    Physically abused: Not on file    Forced sexual activity: Not on file  Other Topics Concern  . Not on file  Social History Narrative  . Not on file   Physical Exam  Constitutional: She is oriented to person, place, and time. She appears well-developed and well-nourished.  Eyes: Pupils are equal, round, and reactive to light. Conjunctivae are normal.  Neck: Normal range of motion. Neck supple.  Cardiovascular: Normal rate, regular rhythm, normal heart sounds and intact distal pulses.  Pulmonary/Chest: Effort normal and breath sounds normal.  Abdominal: Soft. Bowel sounds are  normal.  Neurological: She is alert and oriented to person, place, and time. She has normal reflexes. She displays normal reflexes. A cranial nerve deficit and sensory deficit is present. She exhibits normal muscle tone. Coordination normal. She displays no Babinski's sign on the right side. She displays no Babinski's sign on the left side.  Right v1,2 deficits  Skin: Skin is warm and dry.  Psychiatric: She has a normal mood and affect. Her behavior is normal. Judgment and thought content normal.   [name] returns. The implant was incorrectly constructed. We are going to place a new implant as the depression, which is affecting her ability to work secondary to the need for her appearance to be where she needed to be is not there with a significant temporal depression secondary to pterional wasting due to the surgical approach. We will correct this, I hope, on April 12th, and a new implant has been constructed. I have reviewed it, and I have approved its modeling. She is alert, oriented by 4, and answering all questions. She understands the risks and benefits while having had the procedure.

## 2017-08-22 NOTE — Anesthesia Postprocedure Evaluation (Signed)
Anesthesia Post Note  Patient: [name]  Procedure(s) Performed: CRANIOPLASTY (N/A Head)     Patient location during evaluation: PACU Anesthesia Type: General Level of consciousness: awake and alert Pain management: pain level controlled Vital Signs Assessment: post-procedure vital signs reviewed and stable Respiratory status: spontaneous breathing, nonlabored ventilation and respiratory function stable Cardiovascular status: blood pressure returned to baseline and stable Postop Assessment: no apparent nausea or vomiting Anesthetic complications: no    Last Vitals:  Vitals:   08/22/17 1300 08/22/17 1320  BP:  (!) 149/86  Pulse:  76  Resp:  18  Temp: 36.9 C   SpO2:  100%    Last Pain:  Vitals:   08/22/17 1330  TempSrc:   PainSc: 0-No pain                 Addysyn Fern,W. EDMOND

## 2017-08-22 NOTE — Op Note (Signed)
08/22/2017  12:28 PM  PATIENT:  [name]  52 y.o. female  PRE-OPERATIVE DIAGNOSIS:  BENIGN NEOPLASM OF MENINGES, UNSPECIFIES  POST-OPERATIVE DIAGNOSIS:  BENIGN NEOPLASM OF MENINGES, UNSPECIFIES  PROCEDURE:  Procedure(s): CRANIOPLASTY  SURGEON: Surgeon(s): Ashok Pall, MD  ASSISTANTS:none  ANESTHESIA:   general  EBL:  Total I/O In: 900 [I.V.:800; IV Piggyback:100] Out: 50 [Blood:50]  BLOOD ADMINISTERED:none  CELL SAVER GIVEN:none  COUNT:per nursing  DRAINS: none   SPECIMEN:  No Specimen  DICTATION: Tamico Mundo was taken to the operating room, intubated, and placed under a general anesthetic without difficulty. She was positioned supine on the operating table. I placed her head on a gel donut.  Her head was shaved, prepped, and draped in a sterile manner. I opened the previous incision and exposed the skull. I removed the previous implant. I then placed the new implant. I used plates and screws to secure it to the skull. I irrigated then closed the incision.  I approximated the temporalis muscle, and the galea with vicryl sutures. I approximated the scalp with staples. I applied a sterile dressing. Ms. Boggio was then awakened and extubed.  PLAN OF CARE: Admit to inpatient   PATIENT DISPOSITION:  PACU - hemodynamically stable.   Delay start of Pharmacological VTE agent (>24hrs) due to surgical blood loss or risk of bleeding:  yes

## 2017-08-22 NOTE — Transfer of Care (Signed)
Immediate Anesthesia Transfer of Care Note  Patient: [name]  Procedure(s) Performed: CRANIOPLASTY (N/A Head)  Patient Location: PACU  Anesthesia Type:General  Level of Consciousness: awake, alert , oriented and patient cooperative  Airway & Oxygen Therapy: Patient Spontanous Breathing and Patient connected to nasal cannula oxygen  Post-op Assessment: Report given to RN and Post -op Vital signs reviewed and stable  Post vital signs: Reviewed and stable  Last Vitals:  Vitals Value Taken Time  BP 151/98 08/22/2017 12:15 PM  Temp    Pulse 79 08/22/2017 12:15 PM  Resp 13 08/22/2017 12:15 PM  SpO2 96 % 08/22/2017 12:15 PM  Vitals shown include unvalidated device data.  Last Pain:  Vitals:   08/22/17 0745  TempSrc:   PainSc: 0-No pain      Patients Stated Pain Goal: 3 (66/44/03 4742)  Complications: No apparent anesthesia complications

## 2017-08-23 MED ORDER — HYDROCODONE-ACETAMINOPHEN 5-325 MG PO TABS: 1.0000 | ORAL_TABLET | Freq: Four times a day (QID) | ORAL | 0 refills | Status: DC | PRN

## 2017-08-23 NOTE — Progress Notes (Signed)
Pt seen and examined. No issues overnight. No new complaints, minimal HA. Has been OOB, tolerating diet, voiding normally.  EXAM: Temp:  [97.4 F (36.3 C)-98.5 F (36.9 C)] 97.7 F (36.5 C) (04/13 0800) Pulse Rate:  [57-82] 58 (04/13 0700) Resp:  [8-18] 11 (04/13 0700) BP: (121-156)/(78-135) 134/81 (04/13 0700) SpO2:  [91 %-100 %] 99 % (04/13 0700) Intake/Output      04/12 0701 - 04/13 0700 04/13 0701 - 04/14 0700   I.V. (mL/kg) 2017.3 (23.8)    Other 100    IV Piggyback 200    Total Intake(mL/kg) 2317.3 (27.3)    Urine (mL/kg/hr) 250    Blood 50    Total Output 300    Net +2017.3         Urine Occurrence 5 x 1 x    Awake, alert, oriented Speech fluent, appropriate CN intact Good strength throughout Dressing c/d/i  LABS: Lab Results  Component Value Date   CREATININE 0.92 08/15/2017   BUN 6 08/15/2017   NA 137 08/15/2017   K 4.1 08/15/2017   CL 105 08/15/2017   CO2 25 08/15/2017   Lab Results  Component Value Date   WBC 4.5 08/15/2017   HGB 13.1 08/15/2017   HCT 42.4 08/15/2017   MCV 87.1 08/15/2017   PLT 279 08/15/2017    IMPRESSION: - 52 y.o. female POD#1 s/p right cranioplasty, doing well.  PLAN: - d/c home today

## 2017-08-23 NOTE — Discharge Summary (Signed)
Physician Discharge Summary  Patient ID: [name] MRN: [id number] DOB/AGE: 06-15-1965 52 y.o.  Admit date: 08/22/2017 Discharge date: 08/23/2017  Admission Diagnoses:  Cranial defect  Discharge Diagnoses:  Same Active Problems:   Meningioma [facility])   Discharged Condition: Stable  Hospital Course:  [name] is a 52 y.o. female who previously underwent craniotomy for meningioma and was admitted after elective cranioplasty for cranial defect. She was at baseline postop, with minimal HA, tolerating diet, voiding normally, ambulating without assistance.  Treatments: Surgery - right cranioplasty  Discharge Exam: Blood pressure 134/81, pulse (!) 58, temperature 97.7 F (36.5 C), temperature source Oral, resp. rate 11, height 5\' 10"  (1.778 m), weight 84.8 kg (187 lb), SpO2 99 %. Awake, alert, oriented Speech fluent, appropriate CN grossly intact 5/5 BUE/BLE Wound c/d/i  Disposition: Discharge disposition: 01-Home or Self Care       Discharge Instructions    Call MD for:  redness, tenderness, or signs of infection (pain, swelling, redness, odor or green/yellow discharge around incision site)   Complete by:  As directed    Call MD for:  temperature >100.4   Complete by:  As directed    Diet - low sodium heart healthy   Complete by:  As directed    Discharge instructions   Complete by:  As directed    Walk at home as much as possible, at least 4 times / day   Increase activity slowly   Complete by:  As directed    Lifting restrictions   Complete by:  As directed    No lifting > 10 lbs   May shower / Bathe   Complete by:  As directed    48 hours after surgery   May walk up steps   Complete by:  As directed    No dressing needed   Complete by:  As directed    Other Restrictions   Complete by:  As directed    No bending/twisting at waist     Allergies as of 08/23/2017      Reactions   No Known Allergies    Other    All Meat products, pt is strictly a  vegetarian       Medication List    TAKE these medications   benazepril 10 MG tablet Commonly known as:  LOTENSIN Take 10 mg by mouth daily.   FOLIC ACID PO Take 1 tablet by mouth daily.   HYDROcodone-acetaminophen 5-325 MG tablet Commonly known as:  NORCO/VICODIN Take 1 tablet by mouth every 6 (six) hours as needed for moderate pain.   ibuprofen 600 MG tablet Commonly known as:  ADVIL,MOTRIN Take 600 mg by mouth every 6 (six) hours as needed (for pain.).   levETIRAcetam 500 MG tablet Commonly known as:  KEPPRA Take 500 mg by mouth 2 (two) times daily.   metFORMIN 500 MG 24 hr tablet Commonly known as:  GLUCOPHAGE-XR Take 500 mg by mouth 2 (two) times daily.   multivitamin with minerals Tabs tablet Take 1 tablet by mouth daily.   pravastatin 20 MG tablet Commonly known as:  PRAVACHOL Take 20 mg by mouth daily.   sitaGLIPtin 100 MG tablet Commonly known as:  JANUVIA Take 100 mg by mouth daily.   VITAMIN B 12 PO Take 1 tablet by mouth daily.   VITAMIN D PO Take 1 capsule by mouth daily.      Follow-up Information    Ashok Pall, MD Follow up in 2 week(s).   Specialty:  Neurosurgery  Contact information: [address] [place] [address] [phone]           Signed: Jairo Ben 08/23/2017, 10:07 AM

## 2017-08-26 MED FILL — Thrombin For Soln 20000 Unit: CUTANEOUS | Qty: 1 | Status: AC

## 2017-08-27 ENCOUNTER — Encounter (HOSPITAL_COMMUNITY): Payer: Self-pay | Admitting: Neurosurgery

## 2017-09-24 ENCOUNTER — Ambulatory Visit (HOSPITAL_COMMUNITY)
Admission: RE | Admit: 2017-09-24 | Discharge: 2017-09-24 | Disposition: A | Discharge status: Home / Self Care | Payer: BLUE CROSS/BLUE SHIELD | Source: Ambulatory Visit | Attending: Internal Medicine | Admitting: Internal Medicine

## 2017-09-24 DIAGNOSIS — D329 Benign neoplasm of meninges, unspecified: Secondary | ICD-10-CM | POA: Diagnosis present

## 2017-09-24 DIAGNOSIS — D320 Benign neoplasm of cerebral meninges: Secondary | ICD-10-CM | POA: Diagnosis not present

## 2017-09-24 DIAGNOSIS — G9389 Other specified disorders of brain: Secondary | ICD-10-CM | POA: Diagnosis not present

## 2017-09-24 DIAGNOSIS — Z9889 Other specified postprocedural states: Secondary | ICD-10-CM | POA: Insufficient documentation

## 2017-09-24 MED ORDER — GADOBENATE DIMEGLUMINE 529 MG/ML IV SOLN
20.0000 mL | Freq: Once | INTRAVENOUS | Status: AC | PRN
  Administered 2017-09-24: 17 mL via INTRAVENOUS

## 2017-09-26 ENCOUNTER — Inpatient Hospital Stay: Payer: BLUE CROSS/BLUE SHIELD | Attending: Internal Medicine | Admitting: Internal Medicine

## 2017-09-26 ENCOUNTER — Telehealth: Payer: Self-pay

## 2017-09-26 VITALS — BP 133/89 | HR 59 | Temp 98.1°F | Resp 18 | Ht 70.0 in | Wt 190.3 lb

## 2017-09-26 DIAGNOSIS — Z87891 Personal history of nicotine dependence: Secondary | ICD-10-CM | POA: Diagnosis not present

## 2017-09-26 DIAGNOSIS — E119 Type 2 diabetes mellitus without complications: Secondary | ICD-10-CM | POA: Diagnosis not present

## 2017-09-26 DIAGNOSIS — D329 Benign neoplasm of meninges, unspecified: Secondary | ICD-10-CM | POA: Diagnosis present

## 2017-09-26 DIAGNOSIS — Z79899 Other long term (current) drug therapy: Secondary | ICD-10-CM | POA: Insufficient documentation

## 2017-09-26 DIAGNOSIS — Z7984 Long term (current) use of oral hypoglycemic drugs: Secondary | ICD-10-CM | POA: Insufficient documentation

## 2017-09-26 MED ORDER — LEVETIRACETAM 250 MG PO TABS: 250.0000 mg | ORAL_TABLET | Freq: Two times a day (BID) | ORAL | 5 refills | Status: DC

## 2017-09-26 NOTE — Telephone Encounter (Signed)
Printed avs and calender of upcoming appointment. Per 5/17 los 

## 2017-09-26 NOTE — Progress Notes (Signed)
[name] at [place], [place] [address] [phone]   Interval Evaluation  Date of Service: 09/26/17 Patient Name: [name] Patient MRN: [id number] Patient DOB: July 05, 1965 Provider: Ventura Sellers, MD  Identifying Statement:  [name] is a 52 y.o. female with skull base meningioma   Referring Provider: Ocie Bob, [place] [address]  Oncologic History: 08/30/09: Workup for hyperprolactinemia lead to MRI brain, demonstrates likely meningioma in skull base and cavernous sinus 09/25/16: Clinical and radiographic progression prompt craniotomy, debulking resection by Dr. Christella Noa.  Path WHO grade I meningioma 02/17/17: Completes IMRT with Dr. Tammi Klippel, 54 Gy in 30 fractions   Interval History:  [name] returns for follow up after recent MRI brain.  In the interim she has undergone 2 cranioplasties with Dr. Christella Noa for repair of skull defect and subsequent CSF leak.  She has recovered well from this, no recent issues.  Eye patch is now removed, cornea is healed.  Vision is back to normal with just right eyelid drooping that is mild.  Otherwise she denies new or progressive neurologic deficits.     Medications: Current Outpatient Medications on File Prior to Visit  Medication Sig Dispense Refill  . benazepril (LOTENSIN) 10 MG tablet Take 10 mg by mouth daily.    . Cholecalciferol (VITAMIN D PO) Take 1 capsule by mouth daily.    . Cyanocobalamin (VITAMIN B 12 PO) Take 1 tablet by mouth daily.    Marland Kitchen FOLIC ACID PO Take 1 tablet by mouth daily.    Marland Kitchen HYDROcodone-acetaminophen (NORCO/VICODIN) 5-325 MG tablet Take 1 tablet by mouth every 6 (six) hours as needed for moderate pain. 30 tablet 0  . ibuprofen (ADVIL,MOTRIN) 600 MG tablet Take 600 mg by mouth every 6 (six) hours as needed (for pain.).    Marland Kitchen levETIRAcetam (KEPPRA) 500 MG tablet Take 500 mg by mouth 2 (two) times daily.    .  metFORMIN (GLUCOPHAGE-XR) 500 MG 24 hr tablet Take 500 mg by mouth 2 (two) times daily.    . Multiple Vitamin (MULITIVITAMIN WITH MINERALS) TABS Take 1 tablet by mouth daily.     . pravastatin (PRAVACHOL) 20 MG tablet Take 20 mg by mouth daily.    . sitaGLIPtin (JANUVIA) 100 MG tablet Take 100 mg by mouth daily.     No current facility-administered medications on file prior to visit.     Allergies:  Allergies  Allergen Reactions  . No Known Allergies   . Other     All Meat products, pt is strictly a vegetarian    Past Medical History:  Past Medical History:  Diagnosis Date  . 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 Diabetes mellitus without complication ([place])   . Dysrhythmia    PT STATES SHE HAS AN EXTRA HEART BEAT AT TIMES--FIRST NOTICED BY HER GYN  DR. Cletis Media COUPLE OF YRS AGO--PT STATES SHE WAS SENT FOR STRESS TEST-TREADMILL.  -PT DOESN'T HAVE ANY OTHER INFORMATION.  Marland Kitchen Headache   . Hypertension    B/P ELEVATED LAST 3 DOCTOR VISITS--BUT NO PRIOR HX AND NOT ON B/P MEDS  . Meningioma ([place])    HX OF ELEVATED PROLACTIN LEVELS-FOUND TO HAVE MENINGIOMA ADJACENT TO PITITUARY GLAND--NO OTHER PROBLEMS ASSOC WITH THE TUMOR--NO SURGERY NEEDED--PT SEES DR. [name] CABELL IN [place] ONCE A YEAR  . Seizures ([employer])    mini seizure  1 week after  surgery: pt. wasn't aware she had on,delayed responses to communication,showed up on ct scan   Past Surgical History:  Past Surgical History:  Procedure Laterality Date  . CESAREAN SECTION  06/1990  . CRANIOPLASTY Right 06/25/2017   Procedure: CRANIOPLASTY RIGHT PTERIONAL DEFECT;  Surgeon: Ashok Pall, MD;  Location: [place];  Service: Neurosurgery;  Laterality: Right;  right  . CRANIOPLASTY N/A 08/22/2017   Procedure: CRANIOPLASTY;  Surgeon: Ashok Pall, MD;  Location: [place];  Service: Neurosurgery;  Laterality: N/A;  right  . CRANIOTOMY Right 09/25/2016   Procedure: PTERIONAL CRANIOTOMY  FOR TUMOR;  Surgeon:  Ashok Pall, MD;  Location: [place];  Service: Neurosurgery;  Laterality: Right;  . CYST REMOVED BOTH HANDS  1997 OR 1998  . PARTIAL KNEE ARTHROPLASTY  06/24/2011   Procedure: UNICOMPARTMENTAL KNEE;  Surgeon: Mauri Pole, MD;  Location: [facility];  Service: Orthopedics;  Laterality: Right;  . RIGHT KNEE ARTHROSCOPY  1984   Social History:  Social History   Socioeconomic History  . Marital status: Single    Spouse name: Not on file  . Number of children: Not on file  . Years of education: Not on file  . Highest education level: Not on file  Occupational History  . Not on file  Social Needs  . Financial resource strain: Not on file  . Food insecurity:    Worry: Not on file    Inability: Not on file  . Transportation needs:    Medical: Not on file    Non-medical: Not on file  Tobacco Use  . Smoking status: Former Smoker    Packs/day: 0.25    Years: 4.00    Pack years: 1.00    Types: Cigarettes    Last attempt to quit: 09/18/2010    Years since quitting: 7.0  . Smokeless tobacco: Never Used  . Tobacco comment: QUIT 3 YEARS AGO-ABOUT 2008--SOCIAL SMOKER ONLY  Substance and Sexual Activity  . Alcohol use: No  . Drug use: No  . Sexual activity: Not Currently  Lifestyle  . Physical activity:    Days per week: Not on file    Minutes per session: Not on file  . Stress: Not on file  Relationships  . Social connections:    Talks on phone: Not on file    Gets together: Not on file    Attends religious service: Not on file    Active member of club or organization: Not on file    Attends meetings of clubs or organizations: Not on file    Relationship status: Not on file  . Intimate partner violence:    Fear of current or ex partner: Not on file    Emotionally abused: Not on file    Physically abused: Not on file    Forced sexual activity: Not on file  Other Topics Concern  . Not on file  Social History Narrative  . Not on file   Family History: No family history on  file.  Review of Systems: Constitutional: Denies fevers, chills or abnormal weight loss Eyes: Per HPI Ears, nose, mouth, throat, and face: impaired taste sensation Respiratory: Denies cough, dyspnea or wheezes Cardiovascular: Denies palpitation, chest discomfort or lower extremity swelling Gastrointestinal:  Denies nausea, constipation, diarrhea GU: Denies dysuria or incontinence Skin: Denies abnormal skin rashes Neurological: Per HPI Musculoskeletal: Denies joint pain, back or neck discomfort. No decrease in ROM Behavioral/Psych: Denies anxiety, disturbance in thought content, and mood instability  Physical Exam: Vitals:   09/26/17 1029  BP: 133/89  Pulse: (!) 59  Resp: 18  Temp: 98.1 F (36.7 C)  SpO2: 100%   KPS: 80. General: Alert, cooperative, pleasant, in no acute distress Head: Craniotomy scar noted, dry and intact. EENT: Right eye proptotic, sutured closed Lungs: Resp effort normal Cardiac: Regular rate and rhythm Abdomen: Soft, non-distended abdomen Skin: No rashes cyanosis or petechiae. Extremities: No clubbing or edema  Neurologic Exam: Mental Status: Awake, alert, attentive to examiner. Oriented to self and environment. Language is fluent with intact comprehension.  Cranial Nerves: Visual acuity is grossly normal. Visual fields are full. Extra-ocular movements intact in left eye. Face is symmetric, tongue midline.  Ptosis right eye. Impaired facial sensation in V1-V3 distributions Motor: Tone and bulk are normal. Power is full in both arms and legs. Reflexes are symmetric, no pathologic reflexes present. Intact finger to nose bilaterally Sensory: Intact to light touch and temperature Gait: Normal and tandem gait is normal.   Labs: I have reviewed the data as listed    Component Value Date/Time   NA 137 08/15/2017 0858   K 4.1 08/15/2017 0858   CL 105 08/15/2017 0858   CO2 25 08/15/2017 0858   GLUCOSE 82 08/15/2017 0858   BUN 6 08/15/2017 0858    CREATININE 0.92 08/15/2017 0858   CALCIUM 9.1 08/15/2017 0858   PROT 7.6 10/03/2016 0247   ALBUMIN 3.7 10/03/2016 0247   AST 27 10/03/2016 0247   ALT 22 10/03/2016 0247   ALKPHOS 75 10/03/2016 0247   BILITOT 1.3 (H) 10/03/2016 0247   GFRNONAA >60 08/15/2017 0858   GFRAA >60 08/15/2017 0858   Lab Results  Component Value Date   WBC 4.5 08/15/2017   NEUTROABS 2.7 06/18/2011   HGB 13.1 08/15/2017   HCT 42.4 08/15/2017   MCV 87.1 08/15/2017   PLT 279 08/15/2017    Imaging: [place] Clinician Interpretation: I have personally reviewed the CNS images as listed.  My interpretation, in the context of the patient's clinical presentation, is stable disease  Mr [name] Wo Contrast  Result Date: 09/24/2017 CLINICAL DATA:  Follow-up meningioma resection. Status post RIGHT peroneal craniotomy February 2019 and cranioplasty August 22, 2017. History of cerebral spinal fluid leak. EXAM: MRI HEAD WITHOUT AND WITH CONTRAST TECHNIQUE: Multiplanar, multiecho pulse sequences of the brain and surrounding structures were obtained without and with intravenous contrast. CONTRAST:  85mL MULTIHANCE GADOBENATE DIMEGLUMINE 529 MG/ML IV SOLN COMPARISON:  MRI of the head May 22, 2017 FINDINGS: INTRACRANIAL CONTENTS: Slightly decreased size of transspatial RIGHT central skull base low T2, homogeneously enhancing dural based mass consistent with known meningioma, mild irregularity along the lateral margin corresponding to recent placement of dural sealant improbable deep bulking. Stable effaced RIGHT prepontine cistern with mass effect on RIGHT pons. Stable RIGHT frontotemporal encephalomalacia with small RIGHT middle cranial fossa extra-axial fluid collection. No reduced diffusion to suggest acute ischemia. Minimal postoperative RIGHT extra-axial susceptibility artifact. Mild ex vacuo dilatation RIGHT lateral ventricle, borderline parenchymal brain volume loss for age. No midline shift, mass effect or intraparenchymal  masses. No abnormal intraparenchymal enhancement. Normal postoperative RIGHT dural enhancement. VASCULAR: Meningioma encases and narrows the RIGHT ICA with intact skull base flow voids. SKULL AND UPPER CERVICAL SPINE: Status post RIGHT frontal redo cranioplasty. Small superficial T2 bright nonenhancing fluid collection RIGHT temporal scalp/temporal fossa. No abnormal sellar expansion. No suspicious calvarial bone marrow signal. Craniocervical junction maintained. SINUSES/ORBITS: Trace paranasal sinus mucosal thickening and RIGHT mastoid effusionthe included ocular globes and orbital contents are non-suspicious. OTHER: None. IMPRESSION: 1.  Status post recent RIGHT dural sealant and RIGHT cranioplasty. Slight interval debulking of lateral component, otherwise similar transspatial RIGHT central skull base meningioma. 2. No acute intracranial process. 3. Stable RIGHT frontotemporal encephalomalacia and borderline parenchymal brain volume loss for age. Electronically Signed   By: Elon Alas M.D.   On: 09/24/2017 14:52     Assessment/Plan Meningioma [facility]) We appreciate the opportunity to participate in the care of [name].  She is clinically and radiographically stable today.  We recommend she return to clinic in 6 months with an MRI brain for review.    Keppra can be decreased to 250mg  q12.  All questions were answered. The patient knows to call the clinic with any problems, questions or concerns. No barriers to learning were detected.  The total time spent in the encounter was 25 minutes and more than 50% was on counseling and review of test results   Ventura Sellers, MD Medical Director of Neuro-Oncology [facility] at [place] 09/26/17 10:31 AM

## 2017-10-10 ENCOUNTER — Encounter: Payer: Self-pay | Admitting: *Deleted

## 2017-10-10 NOTE — Progress Notes (Signed)
50 Spoke with Dr. Renda Rolls nurse for the day mentioned that Ms. Morrisette has a concern about her left breast she has received a [place] and mammogram already.  She wants to discuss what she needs to do in the future. [place] with [employer] PA explained the above situation. [place]. [place] to let her know Dr. Renda Rolls nurse would give her a call on Monday.  She stated "I already spoke with someone I plan to send all my information to Dr. Tammi Klippel".  I mentioned that usually you will see a surgeon and have a biopsy to identify abnormal tissue cells.

## 2017-10-28 ENCOUNTER — Telehealth: Payer: Self-pay | Admitting: *Deleted

## 2017-10-28 NOTE — Telephone Encounter (Signed)
"  Learned of a new diagnosis afer a breast biopsy.  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  Return number to reach me, [phone]."

## 2017-10-29 NOTE — Telephone Encounter (Signed)
Spoke with patient regarding her request for referral from Dr. Mickeal Skinner for a breast specialist. Upon further questioning patient had biopsy ordered by her GYN and she wanted to know the appropriate physicians to see at this cancer center for follow up. Provided the names of Dr. Lindi Adie and Dr. Jana Hakim for a referral for the biopsy results. Patient stated that she understands to provide the referral information to her GYN to make the referral. Patient was appreciative of the information.

## 2017-11-24 ENCOUNTER — Other Ambulatory Visit: Payer: Self-pay | Admitting: Surgery

## 2017-12-01 ENCOUNTER — Encounter: Payer: BLUE CROSS/BLUE SHIELD | Attending: Nurse Practitioner | Admitting: Registered"

## 2017-12-01 DIAGNOSIS — E1165 Type 2 diabetes mellitus with hyperglycemia: Secondary | ICD-10-CM | POA: Diagnosis not present

## 2017-12-01 DIAGNOSIS — E118 Type 2 diabetes mellitus with unspecified complications: Secondary | ICD-10-CM

## 2017-12-01 DIAGNOSIS — Z713 Dietary counseling and surveillance: Secondary | ICD-10-CM | POA: Diagnosis present

## 2017-12-01 NOTE — Progress Notes (Signed)
Diabetes Self-Management Education  Visit Type: Follow-up  Appt. Start Time: 0800 Appt. End Time: 0830  12/01/2017  [name], identified by name and date of birth, is a 52 y.o. female with a diagnosis of Diabetes:  .   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  Pt states she has started working with a trainer 3x week and plans to go on her own 3x week. Pt also states she plans to start yoga soon.  Pt reports her Januvia has been discontinued x1 month. Pt brought meter to appointment and within the last few weeks her FBG are within target. Pt states she drinks a lot of water but her lips are still chapped and states that her skin does not appear dry. Pt states she takes an iron supplement.   Pt states she plans to come back at end of year as she has met her insurance deductible, and wants to make sure she is on tract.  Diabetes Self-Management Education - 12/01/17 0813      Visit Information   Visit Type  Follow-up      Complications   How often do you check your blood sugar?  1-2 times/day    Fasting Blood glucose range (mg/dL)  70-129    Number of hypoglycemic episodes per month  0    Number of hyperglycemic episodes per week  0      Dietary Intake   Breakfast  banana, coffee    Snack (morning)  nuts or PB    Lunch  salad sometimes    Snack (afternoon)  cheese    Dinner  tuna on salad or sandwich, crackers, fries    Snack (evening)  none    Beverage(s)  water, coffee, pH water,       Exercise   Exercise Type  Moderate (swimming / aerobic walking)    How many days per week to you exercise?  6    How many minutes per day do you exercise?  30    Total minutes per week of exercise  180      Patient Education   Disease state   Other (comment) insulin resistance long-term condition, but may be managed    Nutrition management   Role  of diet in the treatment of diabetes and the relationship between the three main macronutrients and blood glucose level      Individualized Goals (developed by patient)   Nutrition  General guidelines for healthy choices and portions discussed    Physical Activity  Exercise 5-7 days per week      Outcomes   Expected Outcomes  Demonstrated interest in learning. Expect positive outcomes    Future DMSE  6 months    Program Status  Completed      Subsequent Visit   Since your last visit have you continued or begun to take your medications as prescribed?  Yes    Since your last visit have you experienced any weight changes?  No change    Since your last visit, are you checking your blood glucose at least once a day?  Yes     Individualized Plan for Diabetes Self-Management Training:   Learning Objective:  Patient will have a greater understanding of diabetes self-management. Patient education plan is to attend individual and/or group sessions per assessed needs and concerns.   Patient Instructions  Be  sure to have protein whenever having carbohydrates Continue with your exercise plan/routine Remember stress management is an important part of your management  Expected Outcomes:  Demonstrated interest in learning. Expect positive outcomes  Education material provided: none  If problems or questions, patient to contact team via:  Phone and MyChart  Future DSME appointment: 6 months

## 2017-12-01 NOTE — Patient Instructions (Addendum)
Be sure to have protein whenever having carbohydrates Continue with your exercise plan/routine Remember stress management is an important part of your management

## 2017-12-04 ENCOUNTER — Other Ambulatory Visit: Payer: Self-pay

## 2017-12-04 ENCOUNTER — Encounter (HOSPITAL_BASED_OUTPATIENT_CLINIC_OR_DEPARTMENT_OTHER): Payer: Self-pay | Admitting: *Deleted

## 2017-12-04 NOTE — Pre-Procedure Instructions (Addendum)
PT is coming Monday or Tuesday for BMET and EKG and to pick up Ensure. Pt seemed a little irritated on the phone when I first told her about Ensure drink said " I am not coming to [facility] to just pick up a drink!" I later explained that she needed labs and Ekg, wanted to make an appointment. She said " I want a appointment so I do not have to wait!" Explained to her we do not set up appt but it is first come, first serve. Suggested she may want to come and be here a little after 7 when the lab opens. She stated "someone should be there before 7 am if the lab opens at [place] her I did not want her to be waiting.   Discussed and had Dr. Gifford Shave review history of cranioplasties- ok for surgery.

## 2017-12-05 ENCOUNTER — Other Ambulatory Visit: Payer: Self-pay

## 2017-12-05 ENCOUNTER — Encounter (HOSPITAL_BASED_OUTPATIENT_CLINIC_OR_DEPARTMENT_OTHER)
Admission: RE | Admit: 2017-12-05 | Discharge: 2017-12-05 | Disposition: A | Discharge status: Home / Self Care | Payer: BLUE CROSS/BLUE SHIELD | Source: Ambulatory Visit | Attending: Surgery | Admitting: Surgery

## 2017-12-05 DIAGNOSIS — Z01812 Encounter for preprocedural laboratory examination: Secondary | ICD-10-CM | POA: Insufficient documentation

## 2017-12-05 DIAGNOSIS — Z01810 Encounter for preprocedural cardiovascular examination: Secondary | ICD-10-CM | POA: Diagnosis not present

## 2017-12-05 DIAGNOSIS — E119 Type 2 diabetes mellitus without complications: Secondary | ICD-10-CM | POA: Insufficient documentation

## 2017-12-05 LAB — BASIC METABOLIC PANEL
Anion gap: 11 (ref 5–15)
BUN: 6 mg/dL (ref 6–20)
CALCIUM: 9.2 mg/dL (ref 8.9–10.3)
CO2: 25 mmol/L (ref 22–32)
CREATININE: 0.9 mg/dL (ref 0.44–1.00)
Chloride: 104 mmol/L (ref 98–111)
GFR calc Af Amer: >60 mL/min (ref >60)
GLUCOSE: 111 mg/dL — AB (ref 70–99)
POTASSIUM: 4.7 mmol/L (ref 3.5–5.1)
SODIUM: 140 mmol/L (ref 135–145)

## 2017-12-05 NOTE — Pre-Procedure Instructions (Signed)
Dr.Turk reviewed EKG- ok for surgery. Pt given Ensure and instructed to drink by 0600 day of surgery with teach back method.

## 2017-12-10 NOTE — H&P (Signed)
[name] Documented: 11/11/2017 3:52 PM Location: [facility] Patient #: [id number] DOB: [birth year] Single / Language: [name] / Race: Black or African American Female   History of Present Illness ([name] A. Ninfa [name] MD; 11/11/2017 4:08 PM) The patient is a 52 year old female who presents with a complaint of Breast problems. This patient is referred by Dr. Jamey Reas for problems of the right breast. She has a complex history of a prolactinoma and has had nipple discharge in the past. She has had surgery on her brain secondary to the tumor. Back on Memorial weekend of this year, she developed pain and milky discharge from her nipple of the left breast. She developed a firm mass in the breast. She has since had an ultrasound showing a mass and then a stereotactic biopsy and [facility]. This showed findings consistent with benign mastitis. She has been on a course of antibiotics and has improved but still is a firmness in the breast. There is no family history of breast cancer and she has never had problems with her breasts.   Past Surgical History [name], [place]; 11/11/2017 3:52 PM) Breast Biopsy  Left. Knee Surgery  Right.  Diagnostic Studies History [name], [place]; 11/11/2017 3:52 PM) Colonoscopy  1-5 years ago Mammogram  within last year Pap Smear  1-5 years ago  Allergies [name], [place]; 11/11/2017 3:55 PM) No Known Allergies [11/11/2017]:  Medication History ([name], CMA; 11/11/2017 3:58 PM) LevETIRAcetam (250MG  Tablet, Oral) Active. Benazepril HCl (10MG  Tablet, Oral) Active. Ibuprofen (600MG  Tablet, Oral) Active. MetFORMIN HCl ER (500MG  Tablet ER 24HR, Oral) Active. Pravastatin Sodium (20MG  Tablet, Oral) Active. Multivitamin Adult (Oral) Active. Medications Reconciled  Social History [name], CMA; 11/11/2017 3:52 PM) Alcohol use  Occasional alcohol use. Caffeine use  Coffee. No drug use  Tobacco use   Former smoker.  Family History [name], [place]; 11/11/2017 3:52 PM) Arthritis  Father, Mother. Diabetes Mellitus  Mother, Sister. Hypertension  Father, Mother.  Pregnancy / Birth History [name], [place]; 11/11/2017 3:52 PM) Age at menarche  32 years. Contraceptive History  Contraceptive implant, Oral contraceptives. Gravida  4 Irregular periods  Length (months) of breastfeeding  7-12 Maternal age  93-25 Para  2  Other Problems ([name], [place]; 11/11/2017 3:52 PM) Arthritis  Diabetes Mellitus  High blood pressure  Hypercholesterolemia  Lump In Breast  Thyroid Disease     Review of Systems ([name] CMA; 11/11/2017 3:52 PM) General Not Present- Appetite Loss, Chills, Fatigue, Fever, Night Sweats, Weight Gain and Weight Loss. Skin Not Present- Change in Wart/Mole, Dryness, Hives, Jaundice, New Lesions, Non-Healing Wounds, Rash and Ulcer. HEENT Present- Hearing Loss. Not Present- Earache, Hoarseness, Nose Bleed, Oral Ulcers, Ringing in the Ears, Seasonal Allergies, Sinus Pain, Sore Throat, Visual Disturbances, Wears glasses/contact lenses and Yellow Eyes. Breast Present- Breast Mass, Breast Pain, Nipple Discharge and Skin Changes. Cardiovascular Not Present- Chest Pain, Difficulty Breathing Lying Down, Leg Cramps, Palpitations, Rapid Heart Rate, Shortness of Breath and Swelling of Extremities. Gastrointestinal Not Present- Abdominal Pain, Bloating, Bloody Stool, Change in Bowel Habits, Chronic diarrhea, Constipation, Difficulty Swallowing, Excessive gas, Gets full quickly at meals, Hemorrhoids, Indigestion, Nausea, Rectal Pain and Vomiting. Female Genitourinary Present- Frequency. Not Present- Nocturia, Painful Urination, Pelvic Pain and Urgency. Musculoskeletal Not Present- Back Pain, Joint Pain, Joint Stiffness, Muscle Pain, Muscle Weakness and Swelling of Extremities. Neurological Not Present- Decreased Memory, Fainting, Headaches, Numbness, Seizures,  Tingling, Tremor, Trouble walking and Weakness. Psychiatric Not Present- Anxiety, Bipolar, Change in  Sleep Pattern, Depression, Fearful and Frequent crying. Endocrine Not Present- Cold Intolerance, Excessive Hunger, Hair Changes, Heat Intolerance, Hot flashes and New Diabetes. Hematology Not Present- Blood Thinners, Easy Bruising, Excessive bleeding, Gland problems, HIV and Persistent Infections.  Vitals ([name] CMA; 11/11/2017 3:55 PM) 11/11/2017 3:53 PM Weight: 188.13 lb Height: 69in Body Surface Area: 2.01 m Body Mass Index: 27.78 kg/m  Temp.: 98.60F  Pulse: 69 (Regular)  P.OX: 98% (Room air) BP: 134/84 (Sitting, Left Arm, Standard)       Physical Exam (Avary Pitsenbarger A. Ninfa [name] MD; 11/11/2017 4:09 PM) The physical exam findings are as follows: Note:On physical examination, her left breast is normal in appearance. There is firmness at the 5 o'clock position adjacent to the areola. Again, there are no skin changes and no erythema. The area is minimally tender.  I have reviewed her mammograms, ultrasound, and pathology results    Assessment & Plan (Oluwatobi Ruppe A. Ninfa [name] MD; 11/11/2017 4:10 PM) ACUTE MASTITIS OF LEFT BREAST (N61.0) Impression: This appears to be consistent with acute mastitis. There may be an underlying abscess. I am then place her on clindamycin to see if this will resolve without the need for further intervention. I will see her back on July 15. If there still is a residual mass, I will get an ultrasound to see if any fluid easily aspirated. It is placed is not completely resolved, we may need to proceed with a surgical incisional biopsy in the operating room just to be certain there is no malignancy. She is in agreement with the plan. Current Plans Started Clindamycin HCl 300 MG Oral Capsule, 1 (one) Capsule three times daily, #21, 11/11/2017, No Refill. Follow up with [place] in the office in 2 weeks.  Addendum: ACUTE MASTITIS OF LEFT BREAST  (N61.0) Impression: We discussed options which included a left breast lumpectomy for histologic evaluation to rule out a malignancy as well as continued nonoperative management. Given the persistence of the mass and occasional discharge, she wishes to proceed with a left breast lumpectomy which I believe is reasonable given the firmness of the mass. We discussed the surgical procedure in detail. I discussed the risks which includes but is not limited to bleeding, infection, recurrence, the findings of malignancy, the need for further procedure, postoperative recovery, DVT, etc. She understands and wishes to proceed with surgery

## 2017-12-11 ENCOUNTER — Ambulatory Visit (HOSPITAL_BASED_OUTPATIENT_CLINIC_OR_DEPARTMENT_OTHER): Payer: BLUE CROSS/BLUE SHIELD | Admitting: Anesthesiology

## 2017-12-11 ENCOUNTER — Ambulatory Visit (HOSPITAL_BASED_OUTPATIENT_CLINIC_OR_DEPARTMENT_OTHER)
Admission: RE | Admit: 2017-12-11 | Discharge: 2017-12-11 | Disposition: A | Discharge status: Home / Self Care | Payer: BLUE CROSS/BLUE SHIELD | Source: Ambulatory Visit | Attending: Surgery | Admitting: Surgery

## 2017-12-11 ENCOUNTER — Encounter (HOSPITAL_BASED_OUTPATIENT_CLINIC_OR_DEPARTMENT_OTHER)
Admission: RE | Disposition: A | Discharge status: Home / Self Care | Payer: Self-pay | Source: Ambulatory Visit | Attending: Surgery

## 2017-12-11 ENCOUNTER — Other Ambulatory Visit: Payer: Self-pay

## 2017-12-11 ENCOUNTER — Encounter (HOSPITAL_BASED_OUTPATIENT_CLINIC_OR_DEPARTMENT_OTHER): Payer: Self-pay | Admitting: Anesthesiology

## 2017-12-11 DIAGNOSIS — N61 Mastitis without abscess: Secondary | ICD-10-CM | POA: Insufficient documentation

## 2017-12-11 DIAGNOSIS — Z87891 Personal history of nicotine dependence: Secondary | ICD-10-CM | POA: Insufficient documentation

## 2017-12-11 DIAGNOSIS — I10 Essential (primary) hypertension: Secondary | ICD-10-CM | POA: Insufficient documentation

## 2017-12-11 DIAGNOSIS — N6320 Unspecified lump in the left breast, unspecified quadrant: Secondary | ICD-10-CM | POA: Diagnosis present

## 2017-12-11 DIAGNOSIS — Z7984 Long term (current) use of oral hypoglycemic drugs: Secondary | ICD-10-CM | POA: Insufficient documentation

## 2017-12-11 DIAGNOSIS — N6012 Diffuse cystic mastopathy of left breast: Secondary | ICD-10-CM | POA: Diagnosis not present

## 2017-12-11 DIAGNOSIS — Z79899 Other long term (current) drug therapy: Secondary | ICD-10-CM | POA: Insufficient documentation

## 2017-12-11 DIAGNOSIS — M1990 Unspecified osteoarthritis, unspecified site: Secondary | ICD-10-CM | POA: Diagnosis not present

## 2017-12-11 DIAGNOSIS — N6452 Nipple discharge: Secondary | ICD-10-CM | POA: Insufficient documentation

## 2017-12-11 DIAGNOSIS — R569 Unspecified convulsions: Secondary | ICD-10-CM | POA: Diagnosis not present

## 2017-12-11 DIAGNOSIS — E119 Type 2 diabetes mellitus without complications: Secondary | ICD-10-CM | POA: Diagnosis not present

## 2017-12-11 DIAGNOSIS — E7800 Pure hypercholesterolemia, unspecified: Secondary | ICD-10-CM | POA: Insufficient documentation

## 2017-12-11 HISTORY — DX: Myoneural disorder, unspecified: G70.9

## 2017-12-11 HISTORY — PX: BREAST LUMPECTOMY: SHX2

## 2017-12-11 HISTORY — DX: Anesthesia of skin: R20.0

## 2017-12-11 LAB — GLUCOSE, CAPILLARY
GLUCOSE-CAPILLARY: 83 mg/dL (ref 70–99)
Glucose-Capillary: 89 mg/dL (ref 70–99)

## 2017-12-11 SURGERY — BREAST LUMPECTOMY
Anesthesia: General | Site: Breast | Laterality: Left

## 2017-12-11 MED ORDER — DEXAMETHASONE SODIUM PHOSPHATE 10 MG/ML IJ SOLN
INTRAMUSCULAR | Status: AC
  Filled 2017-12-11: qty 1

## 2017-12-11 MED ORDER — LACTATED RINGERS IV SOLN
INTRAVENOUS | Status: DC
  Administered 2017-12-11: 09:00:00 via INTRAVENOUS

## 2017-12-11 MED ORDER — PROPOFOL 10 MG/ML IV BOLUS
INTRAVENOUS | Status: AC
  Filled 2017-12-11: qty 20

## 2017-12-11 MED ORDER — ACETAMINOPHEN 500 MG PO TABS
1000.0000 mg | ORAL_TABLET | ORAL | Status: AC
  Administered 2017-12-11: 1000 mg via ORAL

## 2017-12-11 MED ORDER — SCOPOLAMINE 1 MG/3DAYS TD PT72: 1.0000 | MEDICATED_PATCH | Freq: Once | TRANSDERMAL | Status: DC | PRN

## 2017-12-11 MED ORDER — MIDAZOLAM HCL 2 MG/2ML IJ SOLN
1.0000 mg | INTRAMUSCULAR | Status: DC | PRN
  Administered 2017-12-11: 2 mg via INTRAVENOUS

## 2017-12-11 MED ORDER — CHLORHEXIDINE GLUCONATE CLOTH 2 % EX PADS: 6.0000 | MEDICATED_PAD | Freq: Once | CUTANEOUS | Status: DC

## 2017-12-11 MED ORDER — FENTANYL CITRATE (PF) 100 MCG/2ML IJ SOLN: 25.0000 ug | INTRAMUSCULAR | Status: DC | PRN

## 2017-12-11 MED ORDER — GABAPENTIN 300 MG PO CAPS
ORAL_CAPSULE | ORAL | Status: AC
  Filled 2017-12-11: qty 1

## 2017-12-11 MED ORDER — FENTANYL CITRATE (PF) 100 MCG/2ML IJ SOLN
50.0000 ug | INTRAMUSCULAR | Status: DC | PRN
  Administered 2017-12-11: 50 ug via INTRAVENOUS

## 2017-12-11 MED ORDER — GABAPENTIN 300 MG PO CAPS
300.0000 mg | ORAL_CAPSULE | ORAL | Status: AC
  Administered 2017-12-11: 300 mg via ORAL

## 2017-12-11 MED ORDER — MIDAZOLAM HCL 2 MG/2ML IJ SOLN
INTRAMUSCULAR | Status: AC
  Filled 2017-12-11: qty 2

## 2017-12-11 MED ORDER — BUPIVACAINE-EPINEPHRINE 0.5% -1:200000 IJ SOLN
INTRAMUSCULAR | Status: DC | PRN
  Administered 2017-12-11: 17 mL

## 2017-12-11 MED ORDER — ONDANSETRON HCL 4 MG/2ML IJ SOLN
INTRAMUSCULAR | Status: AC
  Filled 2017-12-11: qty 2

## 2017-12-11 MED ORDER — LIDOCAINE HCL (CARDIAC) PF 100 MG/5ML IV SOSY
PREFILLED_SYRINGE | INTRAVENOUS | Status: DC | PRN
  Administered 2017-12-11: 100 mg via INTRAVENOUS

## 2017-12-11 MED ORDER — CELECOXIB 200 MG PO CAPS
ORAL_CAPSULE | ORAL | Status: AC
  Filled 2017-12-11: qty 1

## 2017-12-11 MED ORDER — ONDANSETRON HCL 4 MG/2ML IJ SOLN
INTRAMUSCULAR | Status: DC | PRN
  Administered 2017-12-11: 4 mg via INTRAVENOUS

## 2017-12-11 MED ORDER — CELECOXIB 200 MG PO CAPS
200.0000 mg | ORAL_CAPSULE | ORAL | Status: AC
  Administered 2017-12-11: 200 mg via ORAL

## 2017-12-11 MED ORDER — FENTANYL CITRATE (PF) 100 MCG/2ML IJ SOLN
INTRAMUSCULAR | Status: AC
  Filled 2017-12-11: qty 2

## 2017-12-11 MED ORDER — CEFAZOLIN SODIUM-DEXTROSE 2-4 GM/100ML-% IV SOLN
2.0000 g | INTRAVENOUS | Status: AC
  Administered 2017-12-11: 2 g via INTRAVENOUS

## 2017-12-11 MED ORDER — DEXAMETHASONE SODIUM PHOSPHATE 4 MG/ML IJ SOLN
INTRAMUSCULAR | Status: DC | PRN
  Administered 2017-12-11: 5 mg via INTRAVENOUS

## 2017-12-11 MED ORDER — ACETAMINOPHEN 500 MG PO TABS
ORAL_TABLET | ORAL | Status: AC
  Filled 2017-12-11: qty 2

## 2017-12-11 MED ORDER — OXYCODONE HCL 5 MG PO TABS
5.0000 mg | ORAL_TABLET | Freq: Four times a day (QID) | ORAL | 0 refills | Status: DC | PRN
Start: 2017-12-11 — End: 2019-07-09

## 2017-12-11 MED ORDER — PROMETHAZINE HCL 25 MG/ML IJ SOLN: 6.2500 mg | INTRAMUSCULAR | Status: DC | PRN

## 2017-12-11 MED ORDER — LIDOCAINE HCL (CARDIAC) PF 100 MG/5ML IV SOSY
PREFILLED_SYRINGE | INTRAVENOUS | Status: AC
  Filled 2017-12-11: qty 5

## 2017-12-11 MED ORDER — CEFAZOLIN SODIUM-DEXTROSE 2-4 GM/100ML-% IV SOLN
INTRAVENOUS | Status: AC
  Filled 2017-12-11: qty 100

## 2017-12-11 MED ORDER — PROPOFOL 10 MG/ML IV BOLUS
INTRAVENOUS | Status: DC | PRN
  Administered 2017-12-11: 150 mg via INTRAVENOUS

## 2017-12-11 SURGICAL SUPPLY — 44 items
ADH SKN CLS APL DERMABOND .7 (GAUZE/BANDAGES/DRESSINGS) ×1
BLADE HEX COATED 2.75 (ELECTRODE) ×3 IMPLANT
BLADE SURG 15 STRL LF DISP TIS (BLADE) ×1 IMPLANT
BLADE SURG 15 STRL SS (BLADE) ×3
CANISTER SUCT 1200ML W/VALVE (MISCELLANEOUS) IMPLANT
CHLORAPREP W/TINT 26ML (MISCELLANEOUS) ×3 IMPLANT
CLIP VESOCCLUDE SM WIDE 6/CT (CLIP) IMPLANT
COVER BACK TABLE 60X90IN (DRAPES) ×3 IMPLANT
COVER MAYO STAND STRL (DRAPES) ×3 IMPLANT
DECANTER SPIKE VIAL GLASS SM (MISCELLANEOUS) IMPLANT
DERMABOND ADVANCED (GAUZE/BANDAGES/DRESSINGS) ×2
DERMABOND ADVANCED .7 DNX12 (GAUZE/BANDAGES/DRESSINGS) ×1 IMPLANT
DEVICE DUBIN W/COMP PLATE 8390 (MISCELLANEOUS) IMPLANT
DRAPE LAPAROTOMY 100X72 PEDS (DRAPES) ×3 IMPLANT
DRAPE UTILITY XL STRL (DRAPES) ×3 IMPLANT
ELECT REM PT RETURN 9FT ADLT (ELECTROSURGICAL) ×3
ELECTRODE REM PT RTRN 9FT ADLT (ELECTROSURGICAL) ×1 IMPLANT
GAUZE SPONGE 4X4 12PLY STRL LF (GAUZE/BANDAGES/DRESSINGS) ×3 IMPLANT
GLOVE BIO SURGEON STRL SZ 6.5 (GLOVE) ×1 IMPLANT
GLOVE BIO SURGEONS STRL SZ 6.5 (GLOVE) ×1
GLOVE SURG SIGNA 7.5 PF LTX (GLOVE) ×3 IMPLANT
GOWN STRL REUS W/ TWL LRG LVL3 (GOWN DISPOSABLE) ×1 IMPLANT
GOWN STRL REUS W/ TWL XL LVL3 (GOWN DISPOSABLE) ×1 IMPLANT
GOWN STRL REUS W/TWL 2XL LVL3 (GOWN DISPOSABLE) ×2 IMPLANT
GOWN STRL REUS W/TWL LRG LVL3 (GOWN DISPOSABLE) ×3
GOWN STRL REUS W/TWL XL LVL3 (GOWN DISPOSABLE) ×3
KIT MARKER MARGIN INK (KITS) ×1 IMPLANT
NDL HYPO 25X1 1.5 SAFETY (NEEDLE) ×1 IMPLANT
NEEDLE HYPO 25X1 1.5 SAFETY (NEEDLE) ×3 IMPLANT
NS IRRIG 1000ML POUR BTL (IV SOLUTION) ×3 IMPLANT
PACK BASIN DAY SURGERY FS (CUSTOM PROCEDURE TRAY) ×3 IMPLANT
PENCIL BUTTON HOLSTER BLD 10FT (ELECTRODE) ×3 IMPLANT
SLEEVE SCD COMPRESS KNEE MED (MISCELLANEOUS) ×2 IMPLANT
SPONGE LAP 4X18 RFD (DISPOSABLE) ×3 IMPLANT
SUT MNCRL AB 4-0 PS2 18 (SUTURE) ×3 IMPLANT
SUT SILK 2 0 SH (SUTURE) ×3 IMPLANT
SUT VIC AB 3-0 SH 27 (SUTURE) ×3
SUT VIC AB 3-0 SH 27X BRD (SUTURE) ×1 IMPLANT
SYR CONTROL 10ML LL (SYRINGE) ×3 IMPLANT
TOWEL GREEN STERILE FF (TOWEL DISPOSABLE) ×3 IMPLANT
TOWEL OR NON WOVEN STRL DISP B (DISPOSABLE) ×3 IMPLANT
TUBE CONNECTING 20'X1/4 (TUBING)
TUBE CONNECTING 20X1/4 (TUBING) IMPLANT
YANKAUER SUCT BULB TIP NO VENT (SUCTIONS) IMPLANT

## 2017-12-11 NOTE — Discharge Instructions (Signed)
[place] Office Phone Number [phone]  BREAST BIOPSY/ PARTIAL MASTECTOMY: POST OP INSTRUCTIONS  Always review your discharge instruction sheet given to you by the facility where your surgery was performed.  IF YOU HAVE DISABILITY OR FAMILY LEAVE FORMS, YOU MUST BRING THEM TO THE OFFICE FOR PROCESSING.  DO NOT GIVE THEM TO YOUR DOCTOR.  1. A prescription for pain medication may be given to you upon discharge.  Take your pain medication as prescribed, if needed.  If narcotic pain medicine is not needed, then you may take acetaminophen (Tylenol) or ibuprofen (Advil) as needed. 2. Take your usually prescribed medications unless otherwise directed 3. If you need a refill on your pain medication, please contact your pharmacy.  They will contact our office to request authorization.  Prescriptions will not be filled after 5pm or on week-ends. 4. You should eat very light the first 24 hours after surgery, such as soup, crackers, pudding, etc.  Resume your normal diet the day after surgery. 5. Most patients will experience some swelling and bruising in the breast.  Ice packs and a good support bra will help.  Swelling and bruising can take several days to resolve.  6. It is common to experience some constipation if taking pain medication after surgery.  Increasing fluid intake and taking a stool softener will usually help or prevent this problem from occurring.  A mild laxative (Milk of Magnesia or Miralax) should be taken according to package directions if there are no bowel movements after 48 hours. 7. Unless discharge instructions indicate otherwise, you may remove your bandages 24-48 hours after surgery, and you may shower at that time.  You may have steri-strips (small skin tapes) in place directly over the incision.  These strips should be left on the skin for 7-10 days.  If your surgeon used skin glue on the incision, you may shower in 24 hours.  The glue will flake off over the  next 2-3 weeks.  Any sutures or staples will be removed at the office during your follow-up visit. 8. ACTIVITIES:  You may resume regular daily activities (gradually increasing) beginning the next day.  Wearing a good support bra or sports bra minimizes pain and swelling.  You may have sexual intercourse when it is comfortable. a. You may drive when you no longer are taking prescription pain medication, you can comfortably wear a seatbelt, and you can safely maneuver your car and apply brakes. b. RETURN TO WORK:  ______________________________________________________________________________________ 9. You should see your doctor in the office for a follow-up appointment approximately two weeks after your surgery.  Your doctors nurse will typically make your follow-up appointment when she calls you with your pathology report.  Expect your pathology report 2-3 business days after your surgery.  You may call to check if you do not hear from [place] after three days. 10. OTHER INSTRUCTIONS: _______________________________________________________________________________________________ _____________________________________________________________________________________________________________________________________ _____________________________________________________________________________________________________________________________________ _____________________________________________________________________________________________________________________________________  WHEN TO CALL YOUR DOCTOR: 1. Fever over 101.0 2. Nausea and/or vomiting. 3. Extreme swelling or bruising. 4. Continued bleeding from incision. 5. Increased pain, redness, or drainage from the incision.  The clinic staff is available to answer your questions during regular business hours.  Please dont hesitate to call and ask to speak to one of the nurses for clinical concerns.  If you have a medical emergency, go to the nearest  emergency room or call 911.  A surgeon from [facility] is always on call at the hospital.  For further questions, please visit [email or web address]  Tylenol 1,000mg  given today at 9:00am  Post Anesthesia Home Care Instructions  Activity: Get plenty of rest for the remainder of the day. A responsible individual must stay with you for 24 hours following the procedure.  For the next 24 hours, DO NOT: -Drive a car -Paediatric nurse -Drink alcoholic beverages -Take any medication unless instructed by your physician -Make any legal decisions or sign important papers.  Meals: Start with liquid foods such as gelatin or soup. Progress to regular foods as tolerated. Avoid greasy, spicy, heavy foods. If nausea and/or vomiting occur, drink only clear liquids until the nausea and/or vomiting subsides. Call your physician if vomiting continues.  Special Instructions/Symptoms: Your throat may feel dry or sore from the anesthesia or the breathing tube placed in your throat during surgery. If this causes discomfort, gargle with warm salt water. The discomfort should disappear within 24 hours.  If you had a scopolamine patch placed behind your ear for the management of post- operative nausea and/or vomiting:  1. The medication in the patch is effective for 72 hours, after which it should be removed.  Wrap patch in a tissue and discard in the trash. Wash hands thoroughly with soap and water. 2. You may remove the patch earlier than 72 hours if you experience unpleasant side effects which may include dry mouth, dizziness or visual disturbances. 3. Avoid touching the patch. Wash your hands with soap and water after contact with the patch.

## 2017-12-11 NOTE — Anesthesia Preprocedure Evaluation (Signed)
Anesthesia Evaluation  Patient identified by MRN, date of birth, ID band Patient awake    Reviewed: Allergy & Precautions, H&P , NPO status , Patient's Chart, lab work & pertinent test results  Airway Mallampati: II  TM Distance: >3 FB Neck ROM: Full    Dental no notable dental hx. (+) Teeth Intact, Dental Advisory Given   Pulmonary former smoker,    Pulmonary exam normal breath sounds clear to auscultation       Cardiovascular Exercise Tolerance: Good hypertension, Pt. on medications  Rhythm:Regular Rate:Normal     Neuro/Psych  Headaches, Seizures -, Well Controlled,  Meningioma s/p resection  Neuromuscular disease negative psych ROS   GI/Hepatic negative GI ROS, Neg liver ROS,   Endo/Other  diabetes, Type 2, Oral Hypoglycemic Agents  Renal/GU negative Renal ROS  negative genitourinary   Musculoskeletal  (+) Arthritis , Osteoarthritis,    Abdominal   Peds  Hematology negative hematology ROS (+)   Anesthesia Other Findings   Reproductive/Obstetrics negative OB ROS                             Anesthesia Physical  Anesthesia Plan  ASA: III  Anesthesia Plan: General   Post-op Pain Management:    Induction: Intravenous  PONV Risk Score and Plan: 4 or greater and Ondansetron, Dexamethasone and Midazolam  Airway Management Planned: LMA  Additional Equipment:   Intra-op Plan:   Post-operative Plan: Extubation in OR  Informed Consent: I have reviewed the patients History and Physical, chart, labs and discussed the procedure including the risks, benefits and alternatives for the proposed anesthesia with the patient or authorized representative who has indicated his/her understanding and acceptance.   Dental advisory given  Plan Discussed with: CRNA  Anesthesia Plan Comments:         Anesthesia Quick Evaluation

## 2017-12-11 NOTE — Transfer of Care (Signed)
Immediate Anesthesia Transfer of Care Note  Patient: Coda Filler  Procedure(s) Performed: BREAST LUMPECTOMY (Left Breast)  Patient Location: PACU  Anesthesia Type:General  Level of Consciousness: awake and sedated  Airway & Oxygen Therapy: Patient Spontanous Breathing and Patient connected to face mask oxygen  Post-op Assessment: Report given to RN and Post -op Vital signs reviewed and stable  Post vital signs: Reviewed and stable  Last Vitals:  Vitals Value Taken Time  BP    Temp    Pulse    Resp    SpO2      Last Pain:  Vitals:   12/11/17 0850  PainSc: 0-No pain         Complications: No apparent anesthesia complications

## 2017-12-11 NOTE — Interval H&P Note (Signed)
History and Physical Interval Note: no change in H and P  12/11/3885 1:95 AM  [name]  has presented today for surgery, with the diagnosis of LEFT BREAST MASS AND CHRONIC MASTITIS  The various methods of treatment have been discussed with the patient and family. After consideration of risks, benefits and other options for treatment, the patient has consented to  Procedure(s): BREAST LUMPECTOMY (Left) as a surgical intervention .  The patient's history has been reviewed, patient examined, no change in status, stable for surgery.  I have reviewed the patient's chart and labs.  Questions were answered to the patient's satisfaction.     Ha Shannahan A

## 2017-12-11 NOTE — Anesthesia Procedure Notes (Signed)
Procedure Name: LMA Insertion Performed by: Aleea Hendry W, CRNA Pre-anesthesia Checklist: Patient identified, Emergency Drugs available, Suction available and Patient being monitored Patient Re-evaluated:Patient Re-evaluated prior to induction Oxygen Delivery Method: Circle system utilized Preoxygenation: Pre-oxygenation with 100% oxygen Induction Type: IV induction Ventilation: Mask ventilation without difficulty LMA: LMA inserted LMA Size: 4.0 Number of attempts: 1 Placement Confirmation: positive ETCO2 Tube secured with: Tape Dental Injury: Teeth and Oropharynx as per pre-operative assessment        

## 2017-12-11 NOTE — Op Note (Signed)
BREAST LUMPECTOMY  Procedure Note  [name] [birth year]   Pre-op Diagnosis: LEFT BREAST MASS AND CHRONIC MASTITIS     Post-op Diagnosis: same  Procedure(s): LEFT BREAST LUMPECTOMY  Surgeon(s): Coralie Keens, MD  Anesthesia: General  Staff:  Circulator: Izora Ribas, RN Scrub Person: Lorenza Burton, CST  Estimated Blood Loss: Minimal               Specimens: SENT TO PATH  Indications: This is a 52 year old female who presents with chronic left breast nipple discharge.  She had a biopsy in [place] of a firm mass showing chronic mastitis.  This area is not resolved the decision was made to proceed to the operating room for excision  Procedure: The patient was brought to the operating room and identified as correct patient.  She was placed upon the operating table and anesthesia was induced.  Her left breast was then prepped and draped in the usual sterile fashion.  I anesthetized the skin at the edge of the areola with Marcaine.  I then made a circumareolar incision with a scalpel.  I dissected down to the breast tissue with the cautery.  The patient had a lot of milky fluid in the breast.  I excised the firm area in 2 separate pieces with the electrocautery.  Again, it did seem consistent with mastitis and clogged duct.  The specimens were then sent to pathology for evaluation.  I anesthetized the wound further with Marcaine.  I achieved hemostasis with cautery.  I then closed the area in layers of 3-0 Vicryl sutures.  I then closed the skin with a running 4-0 Monocryl.  Dermabond was then applied.  The patient tolerated procedure well.  All the counts were correct at the end of the procedure.  The patient was then extubated in the operating room and taken in a stable condition to the recovery room          Talal Fritchman A   Date: 12/11/2017  Time: 9:49 AM

## 2017-12-11 NOTE — Anesthesia Postprocedure Evaluation (Signed)
Anesthesia Post Note  Patient: [name]  Procedure(s) Performed: BREAST LUMPECTOMY (Left Breast)     Patient location during evaluation: PACU Anesthesia Type: General Level of consciousness: awake and alert Pain management: pain level controlled Vital Signs Assessment: post-procedure vital signs reviewed and stable Respiratory status: spontaneous breathing, nonlabored ventilation and respiratory function stable Cardiovascular status: blood pressure returned to baseline and stable Postop Assessment: no apparent nausea or vomiting Anesthetic complications: no    Last Vitals:  Vitals:   12/11/17 1115 12/11/17 1156  BP: (!) 144/93 (!) 168/90  Pulse: (!) 58 (!) 51  Resp: 15 16  Temp:  36.5 C  SpO2: 100% 99%    Last Pain:  Vitals:   12/11/17 1156  PainSc: 0-No pain                 Lynda Rainwater

## 2017-12-12 ENCOUNTER — Encounter (HOSPITAL_BASED_OUTPATIENT_CLINIC_OR_DEPARTMENT_OTHER): Payer: Self-pay | Admitting: Surgery

## 2018-01-29 ENCOUNTER — Other Ambulatory Visit: Payer: Self-pay | Admitting: *Deleted

## 2018-01-29 DIAGNOSIS — D329 Benign neoplasm of meninges, unspecified: Secondary | ICD-10-CM

## 2018-03-18 ENCOUNTER — Other Ambulatory Visit: Payer: Self-pay | Admitting: Radiation Therapy

## 2018-03-19 ENCOUNTER — Other Ambulatory Visit: Payer: BLUE CROSS/BLUE SHIELD

## 2018-03-19 ENCOUNTER — Ambulatory Visit
Admission: RE | Admit: 2018-03-19 | Discharge: 2018-03-19 | Disposition: A | Discharge status: Home / Self Care | Payer: BLUE CROSS/BLUE SHIELD | Source: Ambulatory Visit | Attending: Internal Medicine | Admitting: Internal Medicine

## 2018-03-19 DIAGNOSIS — D329 Benign neoplasm of meninges, unspecified: Secondary | ICD-10-CM

## 2018-03-19 MED ORDER — GADOBENATE DIMEGLUMINE 529 MG/ML IV SOLN
18.0000 mL | Freq: Once | INTRAVENOUS | Status: AC | PRN
  Administered 2018-03-19: 18 mL via INTRAVENOUS

## 2018-03-23 ENCOUNTER — Other Ambulatory Visit: Payer: BLUE CROSS/BLUE SHIELD

## 2018-03-26 NOTE — Progress Notes (Signed)
Brain and Spine Tumor Board Documentation  Zondra Lawlor was presented by Cecil Cobbs, MD at Brain and Spine Tumor Board on 03/26/2018, which included representatives from neuro oncology, radiation oncology, surgical oncology, radiology, pathology, navigation.  [name] was presented as a current patient with history of the following treatments:  .  Additionally, we reviewed previous medical and familial history, history of present illness, and recent lab results along with all available histopathologic and imaging studies. The tumor board considered available treatment options and made the following recommendations:  Active surveillance    Tumor board is a meeting of clinicians from various specialty areas who evaluate and discuss patients for whom a multidisciplinary approach is being considered. Final determinations in the plan of care are those of the provider(s). The responsibility for follow up of recommendations given during tumor board is that of the provider.   Today's extended care, comprehensive team conference, Rafaela was not present for the discussion and was not examined.

## 2018-03-27 ENCOUNTER — Telehealth: Payer: Self-pay | Admitting: Internal Medicine

## 2018-03-27 ENCOUNTER — Inpatient Hospital Stay: Payer: BLUE CROSS/BLUE SHIELD | Attending: Internal Medicine | Admitting: Internal Medicine

## 2018-03-27 VITALS — BP 166/106 | HR 63 | Temp 97.9°F | Resp 18 | Ht 70.0 in | Wt 193.5 lb

## 2018-03-27 DIAGNOSIS — Z79899 Other long term (current) drug therapy: Secondary | ICD-10-CM

## 2018-03-27 DIAGNOSIS — Z87891 Personal history of nicotine dependence: Secondary | ICD-10-CM

## 2018-03-27 DIAGNOSIS — E119 Type 2 diabetes mellitus without complications: Secondary | ICD-10-CM | POA: Diagnosis not present

## 2018-03-27 DIAGNOSIS — D329 Benign neoplasm of meninges, unspecified: Secondary | ICD-10-CM

## 2018-03-27 DIAGNOSIS — Z7984 Long term (current) use of oral hypoglycemic drugs: Secondary | ICD-10-CM | POA: Diagnosis not present

## 2018-03-27 DIAGNOSIS — I10 Essential (primary) hypertension: Secondary | ICD-10-CM | POA: Diagnosis not present

## 2018-03-27 DIAGNOSIS — Z923 Personal history of irradiation: Secondary | ICD-10-CM

## 2018-03-27 NOTE — Telephone Encounter (Signed)
Scheduled appt per 11/15 los - gave patient AVS and calender per los.   

## 2018-03-27 NOTE — Progress Notes (Signed)
[facility] at [place], [place] [address] [phone]   Interval Evaluation  Date of Service: 03/27/18 Patient Name: [name] Patient MRN: [id number] Patient DOB: Feb 17, 1966 Provider: Ventura Sellers, MD  Identifying Statement:  [name] is a 52 y.o. female with skull base meningioma   Referring Provider: Ocie Bob, [place] [address]  Oncologic History: 08/30/09: Workup for hyperprolactinemia lead to MRI brain, demonstrates likely meningioma in skull base and cavernous sinus 09/25/16: Clinical and radiographic progression prompt craniotomy, debulking resection by Dr. Christella Noa.  Path WHO grade I meningioma 02/17/17: Completes IMRT with Dr. Tammi Klippel, 54 Gy in 30 fractions   Interval History:  [name] returns for follow up after recent MRI brain. Vision is back to normal with just right eyelid drooping that is mild.  Otherwise she denies new or progressive neurologic deficits.     Medications: Current Outpatient Medications on File Prior to Visit  Medication Sig Dispense Refill  . benazepril (LOTENSIN) 10 MG tablet Take 10 mg by mouth daily.    . Cholecalciferol (VITAMIN D PO) Take 1 capsule by mouth daily.    . Cyanocobalamin (VITAMIN B 12 PO) Take 1 tablet by mouth daily.    Marland Kitchen FOLIC ACID PO Take 1 tablet by mouth daily.    Marland Kitchen HYDROcodone-acetaminophen (NORCO/VICODIN) 5-325 MG tablet Take 1 tablet by mouth every 6 (six) hours as needed for moderate pain. 30 tablet 0  . ibuprofen (ADVIL,MOTRIN) 600 MG tablet Take 600 mg by mouth every 6 (six) hours as needed (for pain.).    Marland Kitchen levETIRAcetam (KEPPRA) 250 MG tablet Take 1 tablet (250 mg total) by mouth 2 (two) times daily. 60 tablet 5  . metFORMIN (GLUCOPHAGE-XR) 500 MG 24 hr tablet Take 500 mg by mouth 2 (two) times daily.    . Multiple Vitamin (MULITIVITAMIN WITH MINERALS) TABS Take 1 tablet by mouth daily.     Marland Kitchen oxyCODONE  (OXY IR/ROXICODONE) 5 MG immediate release tablet Take 1 tablet (5 mg total) by mouth every 6 (six) hours as needed for moderate pain or severe pain. 25 tablet 0  . pravastatin (PRAVACHOL) 20 MG tablet Take 20 mg by mouth daily.     No current facility-administered medications on file prior to visit.     Allergies:  Allergies  Allergen Reactions  . No Known Allergies   . Other     All Meat products, pt is strictly a vegetarian    Past Medical History:  Past Medical History:  Diagnosis Date  . 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 Diabetes mellitus without complication ([facility])   . Dysrhythmia    PT STATES SHE HAS AN EXTRA HEART BEAT AT TIMES--FIRST NOTICED BY HER GYN  DR. Cletis Media COUPLE OF YRS AGO--PT STATES SHE WAS SENT FOR STRESS TEST-TREADMILL.  -PT DOESN'T HAVE ANY OTHER INFORMATION.  Marland Kitchen Headache   . Hypertension    B/P ELEVATED LAST 3 DOCTOR VISITS--BUT NO PRIOR HX AND NOT ON B/P MEDS  . Loss of sensation    right frontal area  . Meningioma ([facility])    HX OF ELEVATED PROLACTIN LEVELS-FOUND TO HAVE MENINGIOMA ADJACENT TO PITITUARY GLAND--NO OTHER PROBLEMS ASSOC WITH THE TUMOR--NO SURGERY NEEDED--PT SEES DR. [name] CABELL IN [place] ONCE A YEAR  . Neuromuscular disorder ([facility])    Loss of nerve sensation on right frontal of face  . Seizures ([facility])  mini seizure  1 week after surgery: pt. wasn't aware she had on,delayed responses to communication,showed up on ct scan   Past Surgical History:  Past Surgical History:  Procedure Laterality Date  . BREAST LUMPECTOMY Left 12/11/2017   Procedure: BREAST LUMPECTOMY;  Surgeon: Coralie Keens, MD;  Location: [facility];  Service: General;  Laterality: Left;  . CESAREAN SECTION  06/1990  . CRANIOPLASTY Right 06/25/2017   Procedure: CRANIOPLASTY RIGHT PTERIONAL DEFECT;  Surgeon: Ashok Pall, MD;  Location: [facility];  Service: Neurosurgery;  Laterality: Right;  right  . CRANIOPLASTY N/A  08/22/2017   Procedure: CRANIOPLASTY;  Surgeon: Ashok Pall, MD;  Location: [facility];  Service: Neurosurgery;  Laterality: N/A;  right  . CRANIOTOMY Right 09/25/2016   Procedure: PTERIONAL CRANIOTOMY  FOR TUMOR;  Surgeon: Ashok Pall, MD;  Location: [facility];  Service: Neurosurgery;  Laterality: Right;  . CYST REMOVED BOTH HANDS  1997 OR 1998  . PARTIAL KNEE ARTHROPLASTY  06/24/2011   Procedure: UNICOMPARTMENTAL KNEE;  Surgeon: Mauri Pole, MD;  Location: [facility];  Service: Orthopedics;  Laterality: Right;  . RIGHT KNEE ARTHROSCOPY  1984   Social History:  Social History   Socioeconomic History  . Marital status: Single    Spouse name: Not on file  . Number of children: Not on file  . Years of education: Not on file  . Highest education level: Not on file  Occupational History  . Not on file  Social Needs  . Financial resource strain: Not on file  . Food insecurity:    Worry: Not on file    Inability: Not on file  . Transportation needs:    Medical: Not on file    Non-medical: Not on file  Tobacco Use  . Smoking status: Former Smoker    Packs/day: 0.25    Years: 4.00    Pack years: 1.00    Types: Cigarettes    Last attempt to quit: 09/18/2010    Years since quitting: 7.5  . Smokeless tobacco: Never Used  . Tobacco comment: QUIT 3 YEARS AGO-ABOUT 2008--SOCIAL SMOKER ONLY  Substance and Sexual Activity  . Alcohol use: No  . Drug use: No  . Sexual activity: Not Currently  Lifestyle  . Physical activity:    Days per week: Not on file    Minutes per session: Not on file  . Stress: Not on file  Relationships  . Social connections:    Talks on phone: Not on file    Gets together: Not on file    Attends religious service: Not on file    Active member of club or organization: Not on file    Attends meetings of clubs or organizations: Not on file    Relationship status: Not on file  . Intimate partner violence:    Fear of current or ex partner: Not on file    Emotionally  abused: Not on file    Physically abused: Not on file    Forced sexual activity: Not on file  Other Topics Concern  . Not on file  Social History Narrative  . Not on file   Family History: No family history on file.  Review of Systems: Constitutional: Denies fevers, chills or abnormal weight loss Eyes: Per HPI Ears, nose, mouth, throat, and face: impaired taste sensation Respiratory: Denies cough, dyspnea or wheezes Cardiovascular: Denies palpitation, chest discomfort or lower extremity swelling Gastrointestinal:  Denies nausea, constipation, diarrhea GU: Denies dysuria or incontinence Skin: Denies abnormal skin  rashes Neurological: Per HPI Musculoskeletal: Denies joint pain, back or neck discomfort. No decrease in ROM Behavioral/Psych: Denies anxiety, disturbance in thought content, and mood instability  Physical Exam: Vitals:   03/27/18 0859  BP: (!) 166/106  Pulse: 63  Resp: 18  Temp: 97.9 F (36.6 C)  SpO2: 100%   KPS: 80. General: Alert, cooperative, pleasant, in no acute distress Head: Craniotomy scar noted, dry and intact. EENT: Right eye proptotic, sutured closed Lungs: Resp effort normal Cardiac: Regular rate and rhythm Abdomen: Soft, non-distended abdomen Skin: No rashes cyanosis or petechiae. Extremities: No clubbing or edema  Neurologic Exam: Mental Status: Awake, alert, attentive to examiner. Oriented to self and environment. Language is fluent with intact comprehension.  Cranial Nerves: Visual acuity is grossly normal. Visual fields are full. Extra-ocular movements intact in left eye. Face is symmetric, tongue midline.  Ptosis right eye. Impaired facial sensation in V1-V3 distributions Motor: Tone and bulk are normal. Power is full in both arms and legs. Reflexes are symmetric, no pathologic reflexes present. Intact finger to nose bilaterally Sensory: Intact to light touch and temperature Gait: Normal and tandem gait is normal.   Labs: I have reviewed  the data as listed    Component Value Date/Time   NA 140 12/05/2017 0930   K 4.7 12/05/2017 0930   CL 104 12/05/2017 0930   CO2 25 12/05/2017 0930   GLUCOSE 111 (H) 12/05/2017 0930   BUN 6 12/05/2017 0930   CREATININE 0.90 12/05/2017 0930   CALCIUM 9.2 12/05/2017 0930   PROT 7.6 10/03/2016 0247   ALBUMIN 3.7 10/03/2016 0247   AST 27 10/03/2016 0247   ALT 22 10/03/2016 0247   ALKPHOS 75 10/03/2016 0247   BILITOT 1.3 (H) 10/03/2016 0247   GFRNONAA >60 12/05/2017 0930   GFRAA >60 12/05/2017 0930   Lab Results  Component Value Date   WBC 4.5 08/15/2017   NEUTROABS 2.7 06/18/2011   HGB 13.1 08/15/2017   HCT 42.4 08/15/2017   MCV 87.1 08/15/2017   PLT 279 08/15/2017    Imaging: [facility] Clinician Interpretation: I have personally reviewed the CNS images as listed.  My interpretation, in the context of the patient's clinical presentation, is stable disease  Mr [name] Wo Contrast  Result Date: 03/19/2018 CLINICAL DATA:  52 year old female with skull base meningioma. Status post surgical debulking May 2018, IMRT October 2018. Two subsequent cranioplasty is for repair of skull defect and CSF leak. Restaging. Creatinine was obtained on site at [facility] at [address] Results: Creatinine 1.0 mg/dL. EXAM: MRI HEAD WITHOUT AND WITH CONTRAST TECHNIQUE: Multiplanar, multiecho pulse sequences of the brain and surrounding structures were obtained without and with intravenous contrast. CONTRAST:  87mL MULTIHANCE GADOBENATE DIMEGLUMINE 529 MG/ML IV SOLN COMPARISON:  09/24/2017 and earlier. FINDINGS: BRAIN Status post right frontotemporal craniotomy and cranioplasty with decreased volume of small postoperative extradural fluid collection underlying the bone flap (series 7, image 15). Increased granulation type enhancement within that collection (series 12, image 73). Superimposed enhancing skull base tumor with epicenter at the right sella and cavernous sinus. Dural thickening extends  over the posterior pituitary as before. Involvement from the right orbital apex to the right prepontine and cerebellar pontine angle cisterns. Prominent involvement of the right foramen ovale redemonstrated. The left cavernous sinus appears to remain spared. The most confluent portion of tumor encompasses 41 x 19 x 29 millimeters (AP by transverse by CC) which is stable since May and mildly smaller compared to January 2019. Broad-based  dural thickening tracking down the clivus and also along the right anterior frontal convexity is stable. Nearby anterior right temporal lobe encephalomalacia is stable. No new signal abnormality or enhancement identified. No restricted diffusion or evidence of acute infarction. No midline shift or ventriculomegaly. Stable basilar cisterns. No acute intracranial hemorrhage. Negative cervicomedullary junction. Vascular: Major intracranial vascular flow voids are stable, including the right ICA siphon which is engulfed by the skull base tumor on series 7, image 10. Skull and upper cervical spine: Normal visible cervical spine. Outside the area of tumor involvement and cranioplasty bone marrow signal remains normal. Sinuses/Orbits: Stable right orbital apex involvement by tumor. Stable orbits soft tissues. Paranasal Visualized paranasal sinuses and mastoids are stable and well pneumatized. Other: Chronic dural thickening in the right internal auditory canal is stable. Negative left IAC. Stable superficial scalp and face soft tissues. IMPRESSION: 1. Stable residual skull base meningioma with notable involvement of the right cavernous sinus, sella, right orbital apex, prepontine cistern, right IAC. 2. Postoperative changes with interval regressed right frontotemporal extradural fluid collection and increasing soft tissue granulation. 3. No new intracranial abnormality. Electronically Signed   By: Genevie Ann M.D.   On: 03/19/2018 11:17     Assessment/Plan Meningioma [facility]) We appreciate  the opportunity to participate in the care of [facility].  She is clinically and radiographically stable today.  We recommend she return to clinic in 6 months with an MRI brain for review.    Keppra can be discontinued in absence of seizure activity, only event was post-operative.  All questions were answered. The patient knows to call the clinic with any problems, questions or concerns. No barriers to learning were detected.  The total time spent in the encounter was 25 minutes and more than 50% was on counseling and review of test results   Ventura Sellers, MD Medical Director of Neuro-Oncology [facility] [place] 03/27/18 8:55 AM

## 2018-03-30 ENCOUNTER — Other Ambulatory Visit: Payer: Self-pay | Admitting: *Deleted

## 2018-03-30 DIAGNOSIS — D329 Benign neoplasm of meninges, unspecified: Secondary | ICD-10-CM

## 2018-04-27 ENCOUNTER — Ambulatory Visit: Payer: BLUE CROSS/BLUE SHIELD | Admitting: Registered"

## 2018-08-11 IMAGING — MR MR HEAD WO/W CM
10 of 13 series · 33 of 48 positions shown · IV contrast (multihance)
Comparison: MRI of the head May 22, 2017

CLINICAL DATA: Follow-up meningioma resection. Status post RIGHT
peroneal craniotomy June 2017 and cranioplasty August 22, 2017.
History of cerebral spinal fluid leak.

EXAM:
MRI HEAD WITHOUT AND WITH CONTRAST
TECHNIQUE: Multiplanar, multiecho pulse sequences of the brain and surrounding
structures were obtained without and with intravenous contrast.
CONTRAST:  17mL MULTIHANCE GADOBENATE DIMEGLUMINE 529 MG/ML IV SOLN

[Series 3: T1 · sagittal · 5.0mm · 0.47mm/px · 2 of 24 slices shown]
[im 1/24]
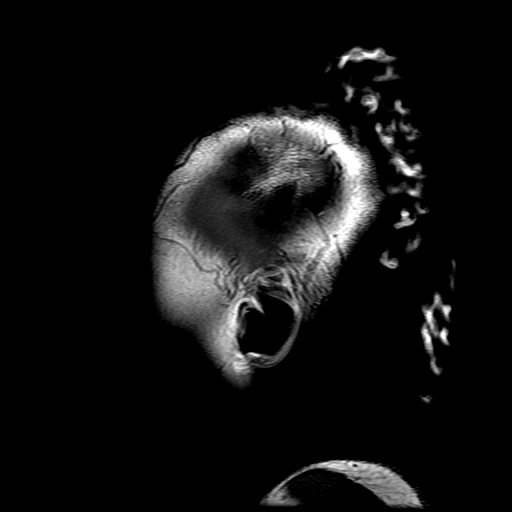
[im 12/24]
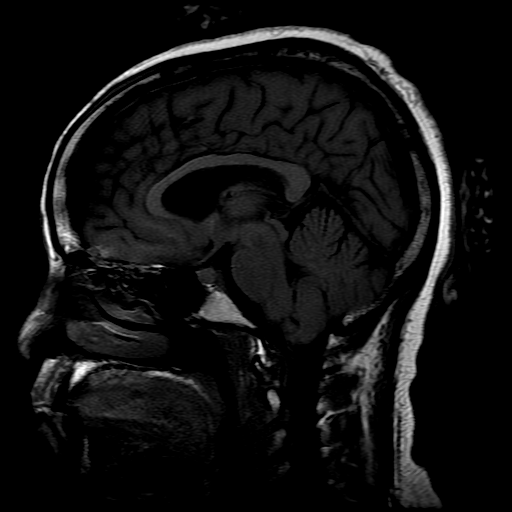

[Series 4: DWI · axial · 3.0mm · 1.09mm/px · z∈[-8,+129]mm · 8 of 98 slices shown (1 of 4)]
[im 1/98]
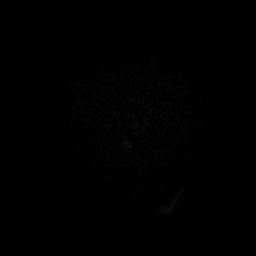
[im 11/98]
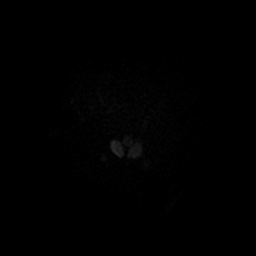
[im 33/98]
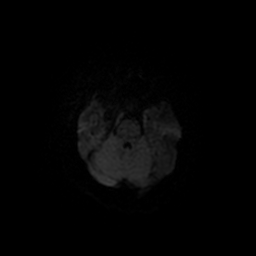
[im 44/98]
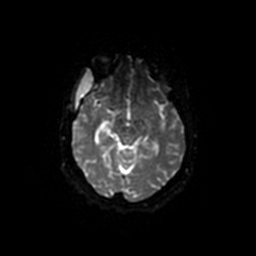
[im 54/98]
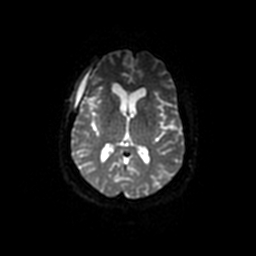
[im 65/98]
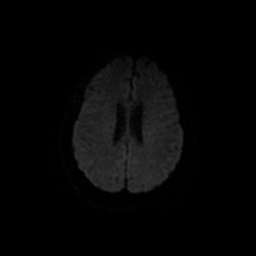
[im 87/98]
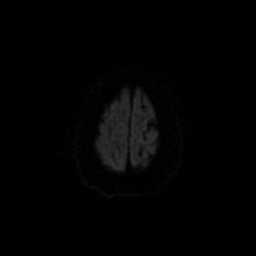
[im 98/98]
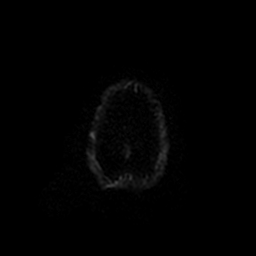

[Series 5: DWI · coronal · 5.0mm · 1.09mm/px · 6 of 66 slices shown (2 of 4)]
[im 1/66]
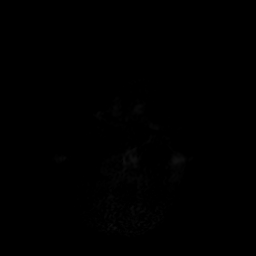
[im 14/66]
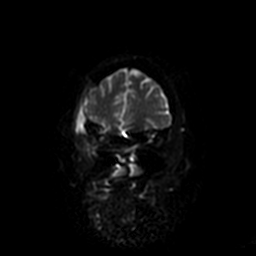
[im 27/66]
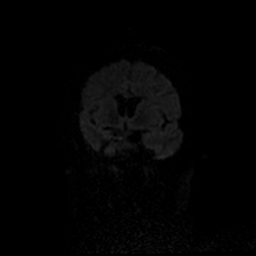
[im 40/66]
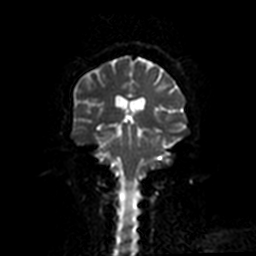
[im 53/66]
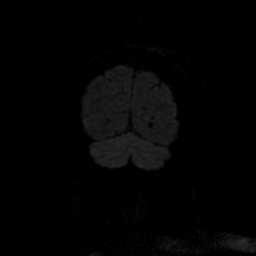
[im 66/66]
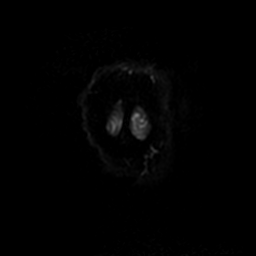

[Series 6: T2 · axial · 5.0mm · 0.43mm/px · z∈[-22,+124]mm · 2 of 23 slices shown]
[im 1/23]
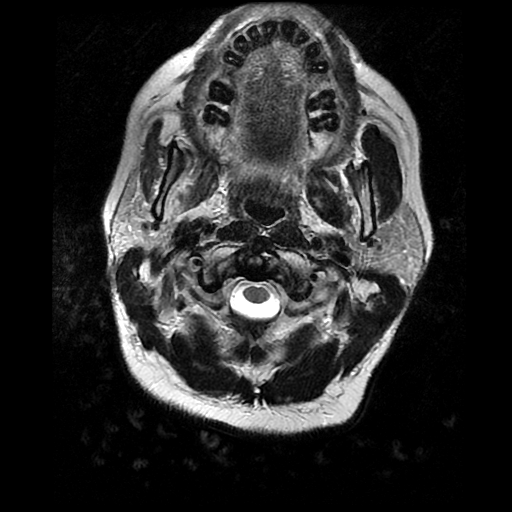
[im 23/23]
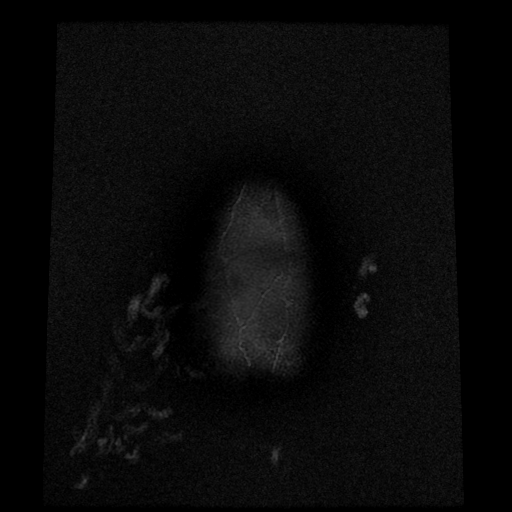

[Series 7: FLAIR · axial · 3.0mm · 0.43mm/px · z∈[-23,+124]mm · 2 of 27 slices shown]
[im 1/27]
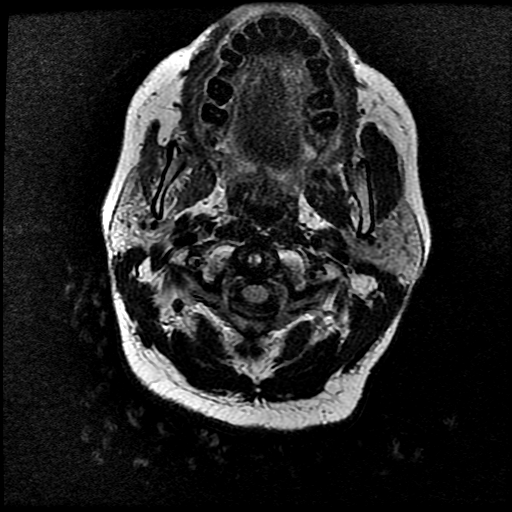
[im 27/27]
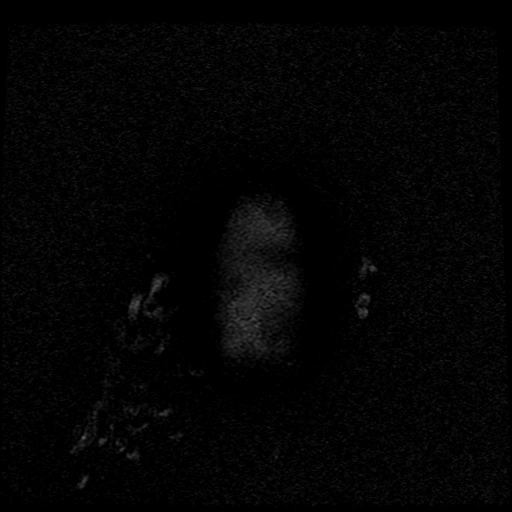

[Series 10: T2 post-contrast · coronal · 5.0mm · 0.45mm/px · 2 of 28 slices shown]
[im 1/28]
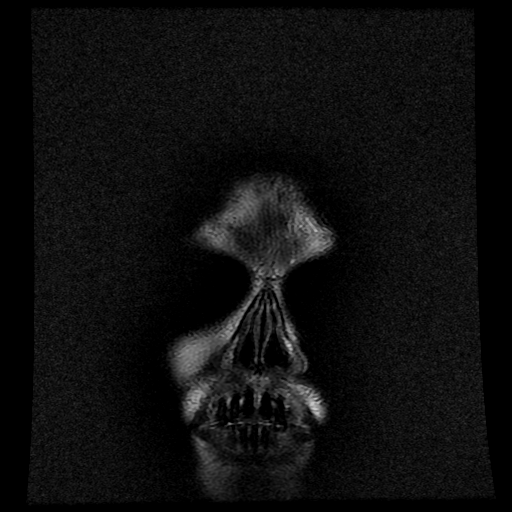
[im 28/28]
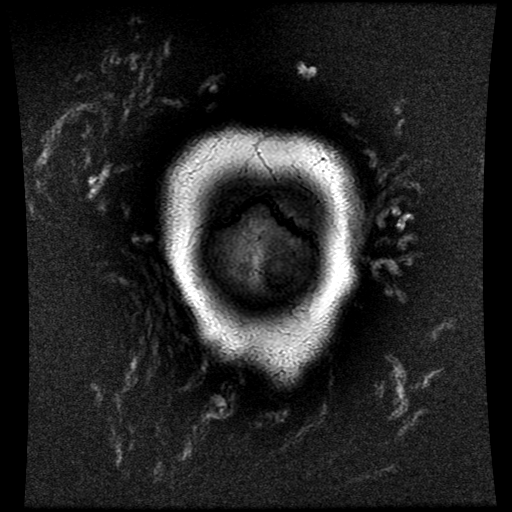

[Series 12: T1 post-contrast · coronal · 5.0mm · 0.45mm/px · 2 of 28 slices shown (1 of 2)]
[im 1/28]
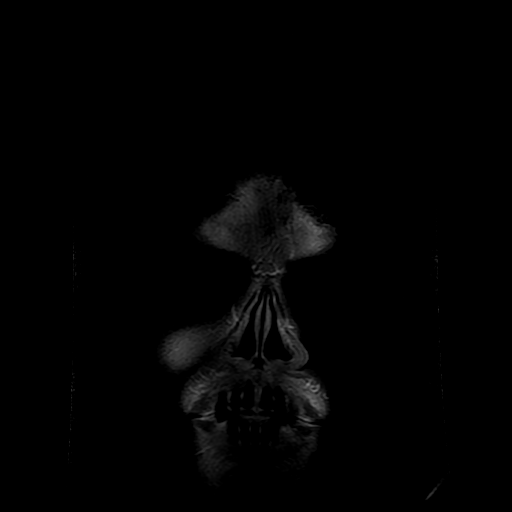
[im 28/28]
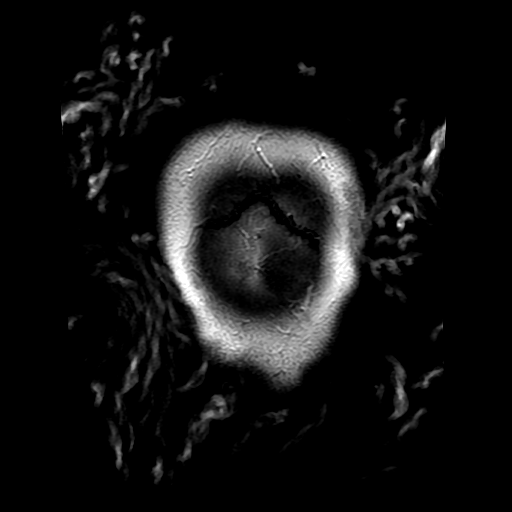

[Series 13: T1 post-contrast · sagittal · 5.0mm · 0.47mm/px · 2 of 26 slices shown (2 of 2)]
[im 1/26]
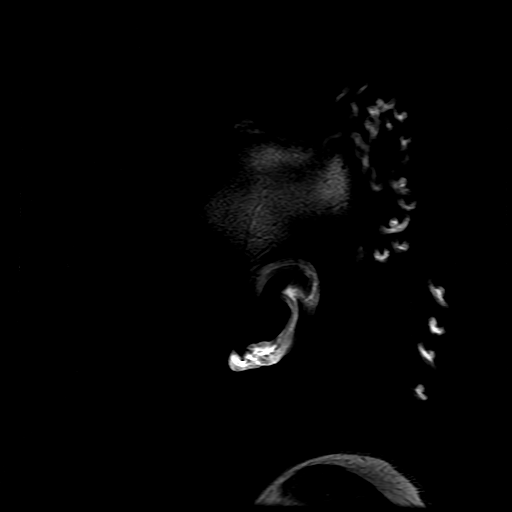
[im 26/26]
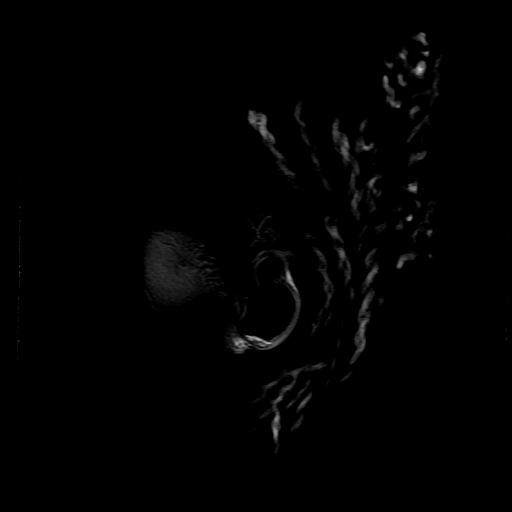

[Series 400: DWI · axial · 3.0mm · 1.09mm/px · z∈[-8,+129]mm · 4 of 49 slices shown (3 of 4)]
[im 1/49]
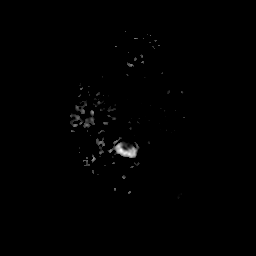
[im 17/49]
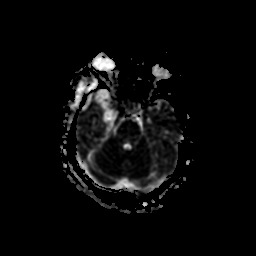
[im 33/49]
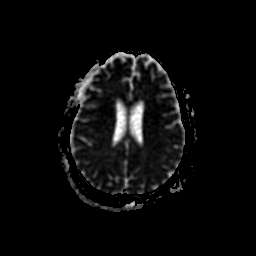
[im 49/49]
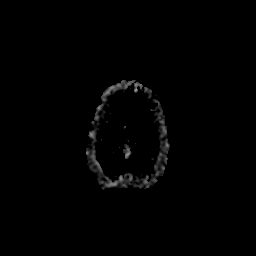

[Series 500: DWI · coronal · 5.0mm · 1.09mm/px · 3 of 33 slices shown (4 of 4)]
[im 1/33]
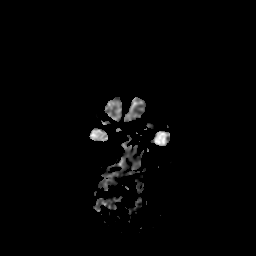
[im 17/33]
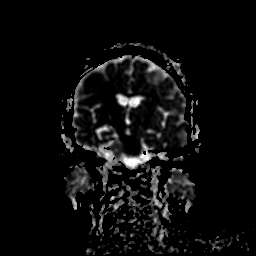
[im 33/33]
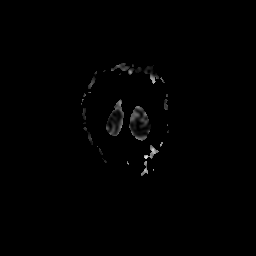

[33 of 48 positions shown; findings below may reference images not displayed]

FINDINGS: INTRACRANIAL CONTENTS: Slightly decreased size of transspatial RIGHT
central skull base low T2, homogeneously enhancing dural based mass
consistent with known meningioma, mild irregularity along the
lateral margin corresponding to recent placement of dural sealant
improbable deep bulking. Stable effaced RIGHT prepontine cistern
with mass effect on RIGHT pons. Stable RIGHT frontotemporal
encephalomalacia with small RIGHT middle cranial fossa extra-axial
fluid collection. No reduced diffusion to suggest acute ischemia.
Minimal postoperative RIGHT extra-axial susceptibility artifact.
Mild ex vacuo dilatation RIGHT lateral ventricle, borderline
parenchymal brain volume loss for age. No midline shift, mass effect
or intraparenchymal masses. No abnormal intraparenchymal
enhancement. Normal postoperative RIGHT dural enhancement.

VASCULAR: Meningioma encases and narrows the RIGHT ICA with intact
skull base flow voids.

SKULL AND UPPER CERVICAL SPINE: Status post RIGHT frontal redo
cranioplasty. Small superficial T2 bright nonenhancing fluid
collection RIGHT temporal scalp/temporal fossa. No abnormal sellar
expansion. No suspicious calvarial bone marrow signal.
Craniocervical junction maintained.

SINUSES/ORBITS: Trace paranasal sinus mucosal thickening and RIGHT
mastoid effusionthe included ocular globes and orbital contents are
non-suspicious.

OTHER: None.
IMPRESSION: 1. Status post recent RIGHT dural sealant and RIGHT cranioplasty.
Slight interval debulking of lateral component, otherwise similar
transspatial RIGHT central skull base meningioma.
2. No acute intracranial process.
3. Stable RIGHT frontotemporal encephalomalacia and borderline
parenchymal brain volume loss for age.

## 2018-09-15 ENCOUNTER — Telehealth: Payer: Self-pay | Admitting: *Deleted

## 2018-09-15 NOTE — Telephone Encounter (Signed)
Called left message to advise that upcoming appt with Dr Mickeal Skinner will be canceled as patient has not scheduled MRI prior to visit.    Pending call back to coordinate MRI and follow up visit to review results.

## 2018-09-25 ENCOUNTER — Ambulatory Visit: Payer: BLUE CROSS/BLUE SHIELD | Admitting: Internal Medicine

## 2018-09-30 ENCOUNTER — Ambulatory Visit: Payer: BLUE CROSS/BLUE SHIELD | Admitting: Internal Medicine

## 2018-09-30 ENCOUNTER — Other Ambulatory Visit: Payer: Self-pay

## 2018-09-30 ENCOUNTER — Ambulatory Visit
Admission: RE | Admit: 2018-09-30 | Discharge: 2018-09-30 | Disposition: A | Discharge status: Home / Self Care | Payer: BLUE CROSS/BLUE SHIELD | Source: Ambulatory Visit | Attending: Internal Medicine | Admitting: Internal Medicine

## 2018-09-30 DIAGNOSIS — D329 Benign neoplasm of meninges, unspecified: Secondary | ICD-10-CM

## 2018-09-30 MED ORDER — GADOBENATE DIMEGLUMINE 529 MG/ML IV SOLN
18.0000 mL | Freq: Once | INTRAVENOUS | Status: AC | PRN
  Administered 2018-09-30: 18 mL via INTRAVENOUS

## 2018-10-01 ENCOUNTER — Telehealth: Payer: Self-pay | Admitting: Internal Medicine

## 2018-10-01 NOTE — Telephone Encounter (Signed)
Left voicemail to confirm appt and verify info. °

## 2018-10-02 ENCOUNTER — Telehealth: Payer: Self-pay | Admitting: Radiation Oncology

## 2018-10-02 ENCOUNTER — Inpatient Hospital Stay: Payer: BLUE CROSS/BLUE SHIELD | Attending: Internal Medicine | Admitting: Internal Medicine

## 2018-10-02 DIAGNOSIS — D329 Benign neoplasm of meninges, unspecified: Secondary | ICD-10-CM | POA: Diagnosis not present

## 2018-10-02 NOTE — Telephone Encounter (Signed)
Received voicemail message requesting return call. Phoned patient back promptly. Patient questions if she has an appointment. Explained she has a telephone appointment with Dr. Mickeal Skinner at 1230 today to review her MRI results. Patient verbalized understanding and expressed appreciation for the return call.

## 2018-10-02 NOTE — Progress Notes (Signed)
I connected with [name] on 17/61/60 at 12:30 PM EDT by telephone visit and verified that I am speaking with the correct person using two identifiers.  I discussed the limitations, risks, security and privacy concerns of performing an evaluation and management service by telemedicine and the availability of in-person appointments. I also discussed with the patient that there may be a patient responsible charge related to this service. The patient expressed understanding and agreed to proceed.  Other persons participating in the visit and their role in the encounter:  n/a  Patient's location:  Home  Provider's location:  Office  Chief Complaint:  Meningioma  History of Present Ilness: No new or progressive neurologic symptoms Observations: Speech clear and fluent Assessment and Plan: MRI demonstrates stable disease.  Recommend follow up MRI scan in 9 months Follow Up Instructions: Return to clinic following next MRI brain  I discussed the assessment and treatment plan with the patient.  The patient was provided an opportunity to ask questions and all were answered.  The patient agreed with the plan and demonstrated understanding of the instructions.    The patient was advised to call back or seek an in-person evaluation if the symptoms worsen or if the condition fails to improve as anticipated.  I provided 5-10 minutes of non-face-to-face time during this enocunter.  Ventura Sellers, MD   I provided 10 minutes of non face-to-face telephone visit time during this encounter, and > 50% was spent counseling as documented under my assessment & plan.

## 2018-10-06 ENCOUNTER — Telehealth: Payer: Self-pay | Admitting: Internal Medicine

## 2018-10-06 NOTE — Telephone Encounter (Signed)
Scheduled appt per 5/22 los.  A calendr will be mailed out.

## 2019-06-08 ENCOUNTER — Telehealth: Payer: Self-pay | Admitting: *Deleted

## 2019-06-08 NOTE — Telephone Encounter (Signed)
Left message for patient to return call.  She needs to schedule her MRI for February in order to keep her appt with Dr Mickeal Skinner since its is to review results.  Pending call back.  Need to provide imaging center contact info.

## 2019-06-22 ENCOUNTER — Other Ambulatory Visit: Payer: Self-pay | Admitting: Radiation Therapy

## 2019-07-05 ENCOUNTER — Other Ambulatory Visit: Payer: Self-pay | Admitting: Internal Medicine

## 2019-07-09 ENCOUNTER — Ambulatory Visit
Admission: RE | Admit: 2019-07-09 | Discharge: 2019-07-09 | Disposition: A | Discharge status: Home / Self Care | Payer: BLUE CROSS/BLUE SHIELD | Source: Ambulatory Visit | Attending: Internal Medicine | Admitting: Internal Medicine

## 2019-07-09 ENCOUNTER — Telehealth: Payer: Self-pay | Admitting: Internal Medicine

## 2019-07-09 ENCOUNTER — Other Ambulatory Visit: Payer: Self-pay

## 2019-07-09 ENCOUNTER — Inpatient Hospital Stay: Payer: BC Managed Care – PPO | Attending: Internal Medicine | Admitting: Internal Medicine

## 2019-07-09 VITALS — BP 152/99 | HR 74 | Temp 97.9°F | Resp 18 | Ht 70.0 in | Wt 198.3 lb

## 2019-07-09 DIAGNOSIS — D329 Benign neoplasm of meninges, unspecified: Secondary | ICD-10-CM

## 2019-07-09 DIAGNOSIS — R600 Localized edema: Secondary | ICD-10-CM | POA: Insufficient documentation

## 2019-07-09 DIAGNOSIS — M6250 Muscle wasting and atrophy, not elsewhere classified, unspecified site: Secondary | ICD-10-CM | POA: Insufficient documentation

## 2019-07-09 DIAGNOSIS — H02401 Unspecified ptosis of right eyelid: Secondary | ICD-10-CM | POA: Diagnosis not present

## 2019-07-09 DIAGNOSIS — G9389 Other specified disorders of brain: Secondary | ICD-10-CM | POA: Insufficient documentation

## 2019-07-09 DIAGNOSIS — Z87891 Personal history of nicotine dependence: Secondary | ICD-10-CM | POA: Insufficient documentation

## 2019-07-09 DIAGNOSIS — Z79899 Other long term (current) drug therapy: Secondary | ICD-10-CM | POA: Insufficient documentation

## 2019-07-09 MED ORDER — GADOBENATE DIMEGLUMINE 529 MG/ML IV SOLN
18.0000 mL | Freq: Once | INTRAVENOUS | Status: AC | PRN
  Administered 2019-07-09: 18 mL via INTRAVENOUS

## 2019-07-09 NOTE — Telephone Encounter (Signed)
Scheduled fu per 2/26 los

## 2019-07-09 NOTE — Progress Notes (Signed)
[facility] [address], [place] [address] [phone]   Interval Evaluation  Date of Service: 07/09/19 Patient Name: [name] Patient MRN: [id number] Patient DOB: 1966/01/14 Provider: Ventura Sellers, MD  Identifying Statement:  [name] is a 54 y.o. female with skull base meningioma   Referring Provider: Ocie Bob, [place] [address]  Oncologic History: 08/30/09: Workup for hyperprolactinemia lead to MRI brain, demonstrates likely meningioma in skull base and cavernous sinus 09/25/16: Clinical and radiographic progression prompt craniotomy, debulking resection by Dr. Christella Noa.  Path WHO grade I meningioma 02/17/17: Completes IMRT with Dr. Tammi Klippel, 54 Gy in 30 fractions   Interval History:  [name] returns for follow up after recent MRI brain. Vision and facial sensory impairments are stable.  Otherwise she denies new or progressive neurologic deficits.     Medications: Current Outpatient Medications on File Prior to Visit  Medication Sig Dispense Refill  . benazepril (LOTENSIN) 10 MG tablet Take 10 mg by mouth daily.    . Cholecalciferol (VITAMIN D PO) Take 1 capsule by mouth daily.    . Cyanocobalamin (VITAMIN B 12 PO) Take 1 tablet by mouth daily.    Marland Kitchen ibuprofen (ADVIL,MOTRIN) 600 MG tablet Take 600 mg by mouth every 6 (six) hours as needed (for pain.).    Marland Kitchen metFORMIN (GLUCOPHAGE-XR) 500 MG 24 hr tablet Take 500 mg by mouth 2 (two) times daily.    . Multiple Vitamin (MULITIVITAMIN WITH MINERALS) TABS Take 1 tablet by mouth daily.     . pravastatin (PRAVACHOL) 20 MG tablet Take 20 mg by mouth daily.     No current facility-administered medications on file prior to visit.    Allergies:  Allergies  Allergen Reactions  . No Known Allergies   . Other     All Meat products, pt is strictly a vegetarian    Past Medical History:  Past Medical History:  Diagnosis  Date  . 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 Diabetes mellitus without complication ([place])   . Dysrhythmia    PT STATES SHE HAS AN EXTRA HEART BEAT AT TIMES--FIRST NOTICED BY HER GYN  DR. Cletis Media COUPLE OF YRS AGO--PT STATES SHE WAS SENT FOR STRESS TEST-TREADMILL.  -PT DOESN'T HAVE ANY OTHER INFORMATION.  Marland Kitchen Headache   . Hypertension    B/P ELEVATED LAST 3 DOCTOR VISITS--BUT NO PRIOR HX AND NOT ON B/P MEDS  . Loss of sensation    right frontal area  . Meningioma ([place])    HX OF ELEVATED PROLACTIN LEVELS-FOUND TO HAVE MENINGIOMA ADJACENT TO PITITUARY GLAND--NO OTHER PROBLEMS ASSOC WITH THE TUMOR--NO SURGERY NEEDED--PT SEES DR. [name] [facility]ELL IN [place] ONCE A YEAR  . Neuromuscular disorder (HCC)    Loss of nerve sensation on right frontal of face  . Seizures ([place])    mini seizure  1 week after surgery: pt. wasn't aware she had on,delayed responses to communication,showed up on ct scan   Past Surgical History:  Past Surgical History:  Procedure Laterality Date  . BREAST LUMPECTOMY Left 12/11/2017   Procedure: BREAST LUMPECTOMY;  Surgeon: Coralie Keens, MD;  Location: [facility];  Service: General;  Laterality: Left;  . CESAREAN SECTION  06/1990  . CRANIOPLASTY Right 06/25/2017   Procedure: CRANIOPLASTY RIGHT PTERIONAL DEFECT;  Surgeon: Ashok Pall, MD;  Location: [facility];  Service: Neurosurgery;  Laterality: Right;  right  . CRANIOPLASTY N/A 08/22/2017   Procedure: CRANIOPLASTY;  Surgeon: Ashok Pall, MD;  Location: [facility];  Service: Neurosurgery;  Laterality: N/A;  right  . CRANIOTOMY Right 09/25/2016   Procedure: PTERIONAL CRANIOTOMY  FOR TUMOR;  Surgeon: Ashok Pall, MD;  Location: [facility];  Service: Neurosurgery;  Laterality: Right;  . CYST REMOVED BOTH HANDS  1997 OR 1998  . PARTIAL KNEE ARTHROPLASTY  06/24/2011   Procedure: UNICOMPARTMENTAL KNEE;  Surgeon: Mauri Pole, MD;  [facility];  Service: Orthopedics;  Laterality:  Right;  . RIGHT KNEE ARTHROSCOPY  1984   Social History:  Social History   Socioeconomic History  . Marital status: Single    Spouse name: Not on file  . Number of children: Not on file  . Years of education: Not on file  . Highest education level: Not on file  Occupational History  . Not on file  Tobacco Use  . Smoking status: Former Smoker    Packs/day: 0.25    Years: 4.00    Pack years: 1.00    Types: Cigarettes    Quit date: 09/18/2010    Years since quitting: 8.8  . Smokeless tobacco: Never Used  . Tobacco comment: QUIT 3 YEARS AGO-ABOUT 2008--SOCIAL SMOKER ONLY  Substance and Sexual Activity  . Alcohol use: No  . Drug use: No  . Sexual activity: Not Currently  Other Topics Concern  . Not on file  Social History Narrative  . Not on file   Social Determinants of Health   Financial Resource Strain:   . Difficulty of Paying Living Expenses: Not on file  Food Insecurity:   . Worried About Charity fundraiser in the Last Year: Not on file  . Ran Out of Food in the Last Year: Not on file  Transportation Needs:   . Lack of Transportation (Medical): Not on file  . Lack of Transportation (Non-Medical): Not on file  Physical Activity:   . Days of Exercise per Week: Not on file  . Minutes of Exercise per Session: Not on file  Stress:   . Feeling of Stress : Not on file  Social Connections:   . Frequency of Communication with Friends and Family: Not on file  . Frequency of Social Gatherings with Friends and Family: Not on file  . Attends Religious Services: Not on file  . Active Member of Clubs or Organizations: Not on file  . Attends Archivist Meetings: Not on file  . Marital Status: Not on file  Intimate Partner Violence:   . Fear of Current or Ex-Partner: Not on file  . Emotionally Abused: Not on file  . Physically Abused: Not on file  . Sexually Abused: Not on file   Family History: No family history on file.  Review of Systems: Constitutional:  Denies fevers, chills or abnormal weight loss Eyes: Per HPI Ears, nose, mouth, throat, and face: impaired taste sensation Respiratory: Denies cough, dyspnea or wheezes Cardiovascular: Denies palpitation, chest discomfort or lower extremity swelling Gastrointestinal:  Denies nausea, constipation, diarrhea GU: Denies dysuria or incontinence Skin: Denies abnormal skin rashes Neurological: Per HPI Musculoskeletal: Denies joint pain, back or neck discomfort. No decrease in ROM Behavioral/Psych: Denies anxiety, disturbance in thought content, and mood instability  Physical Exam: Vitals:   07/09/19 1136  BP: (!) 152/99  Pulse: 74  Resp: 18  Temp: 97.9 F (36.6 C)  SpO2: 100%   KPS: 80. General: Alert, cooperative, pleasant, in no acute distress Head: Craniotomy  scar noted, dry and intact. EENT: Right eye proptotic, sutured closed Lungs: Resp effort normal Cardiac: Regular rate and rhythm Abdomen: Soft, non-distended abdomen Skin: No rashes cyanosis or petechiae. Extremities: No clubbing or edema  Neurologic Exam: Mental Status: Awake, alert, attentive to examiner. Oriented to self and environment. Language is fluent with intact comprehension.  Cranial Nerves: Visual acuity is grossly normal. Visual fields are full. Extra-ocular movements intact in left eye. Face is symmetric, tongue midline.  Ptosis right eye. Impaired facial sensation in V1-V3 distributions Motor: Tone and bulk are normal. Power is full in both arms and legs. Reflexes are symmetric, no pathologic reflexes present. Intact finger to nose bilaterally Sensory: Intact to light touch and temperature Gait: Normal and tandem gait is normal.   Labs: I have reviewed the data as listed    Component Value Date/Time   NA 140 12/05/2017 0930   K 4.7 12/05/2017 0930   CL 104 12/05/2017 0930   CO2 25 12/05/2017 0930   GLUCOSE 111 (H) 12/05/2017 0930   BUN 6 12/05/2017 0930   CREATININE 0.90 12/05/2017 0930   CALCIUM 9.2  12/05/2017 0930   PROT 7.6 10/03/2016 0247   ALBUMIN 3.7 10/03/2016 0247   AST 27 10/03/2016 0247   ALT 22 10/03/2016 0247   ALKPHOS 75 10/03/2016 0247   BILITOT 1.3 (H) 10/03/2016 0247   GFRNONAA >60 12/05/2017 0930   GFRAA >60 12/05/2017 0930   Lab Results  Component Value Date   WBC 4.5 08/15/2017   NEUTROABS 2.7 06/18/2011   HGB 13.1 08/15/2017   HCT 42.4 08/15/2017   MCV 87.1 08/15/2017   PLT 279 08/15/2017    Imaging: [facility] Clinician Interpretation: I have personally reviewed the CNS images as listed.  My interpretation, in the context of the patient's clinical presentation, is stable disease  MR Brain W Wo Contrast  Result Date: 07/09/2019 CLINICAL DATA:  Meningioma follow-up post resection. EXAM: MRI HEAD WITHOUT AND WITH CONTRAST TECHNIQUE: Multiplanar, multiecho pulse sequences of the brain and surrounding structures were obtained without and with intravenous contrast. CONTRAST:  68mL MULTIHANCE GADOBENATE DIMEGLUMINE 529 MG/ML IV SOLN COMPARISON:  Multiple prior studies most recently 09/30/2018 FINDINGS: Brain: Residual meningioma is stable. Enhancing tumor in the right cavernous sinus measures 19 x 14 mm. There is extension into the sella which is unchanged. There is posterior extension into the right prepontine cistern surrounding the trigeminal nerve. Tumor extends along the right tentorium unchanged. There is tumor overlying the foramina ovale with atrophy of the muscles of mastication on the right, unchanged. Tumor extends to the fundus of the internal auditory canal and possibly into the hypoglossal canal on the right, unchanged. Postop right temporal craniotomy for resection. Encephalomalacia right inferior temporal lobe unchanged. Ventricle size normal. No midline shift. No acute infarct. No other neoplasm identified. Vascular: Normal arterial flow voids. Right cavernous carotid is encased by tumor but remains patent. Skull and upper cervical spine: No focal skeletal  lesion. Sinuses/Orbits: Mild mucosal edema paranasal sinuses. Negative orbit. Other: None IMPRESSION: Trans spatial meningioma involving the right skull base and cavernous sinuses stable. Electronically Signed   By: Franchot Gallo M.D.   On: 07/09/2019 10:32     Assessment/Plan Meningioma [facility])   We appreciate the opportunity to participate in the care of [facility].  She is clinically and radiographically stable today.  We recommend she return to clinic in 1 year with an MRI brain for review.    All questions were answered. The patient knows to  call the clinic with any problems, questions or concerns. No barriers to learning were detected.  The total time spent in the encounter was 30 minutes and more than 50% was on counseling and review of test results   Ventura Sellers, MD Medical Director of Neuro-Oncology [facility] at [place] 07/09/19 11:28 AM

## 2020-02-15 ENCOUNTER — Other Ambulatory Visit: Payer: Self-pay | Admitting: Endocrinology

## 2020-02-15 DIAGNOSIS — E041 Nontoxic single thyroid nodule: Secondary | ICD-10-CM

## 2020-02-28 ENCOUNTER — Ambulatory Visit
Admission: RE | Admit: 2020-02-28 | Discharge: 2020-02-28 | Disposition: A | Discharge status: Home / Self Care | Payer: Self-pay | Source: Ambulatory Visit | Attending: Specialist | Admitting: Specialist

## 2020-02-28 ENCOUNTER — Other Ambulatory Visit: Payer: Self-pay | Admitting: Specialist

## 2020-02-28 DIAGNOSIS — E041 Nontoxic single thyroid nodule: Secondary | ICD-10-CM

## 2020-03-02 ENCOUNTER — Other Ambulatory Visit (HOSPITAL_COMMUNITY)
Admission: RE | Admit: 2020-03-02 | Discharge: 2020-03-02 | Disposition: A | Discharge status: Home / Self Care | Payer: BC Managed Care – PPO | Source: Ambulatory Visit | Attending: Endocrinology | Admitting: Endocrinology

## 2020-03-02 ENCOUNTER — Ambulatory Visit
Admission: RE | Admit: 2020-03-02 | Discharge: 2020-03-02 | Disposition: A | Discharge status: Home / Self Care | Payer: BC Managed Care – PPO | Source: Ambulatory Visit | Attending: Endocrinology | Admitting: Endocrinology

## 2020-03-02 DIAGNOSIS — E041 Nontoxic single thyroid nodule: Secondary | ICD-10-CM

## 2020-03-02 DIAGNOSIS — D34 Benign neoplasm of thyroid gland: Secondary | ICD-10-CM | POA: Insufficient documentation

## 2020-03-02 NOTE — Procedures (Signed)
PROCEDURE SUMMARY:  Using direct ultrasound guidance, 5 passes were made using 25 g needles into the nodule within the left lobe of the thyroid.   Ultrasound was used to confirm needle placements on all occasions.   EBL = trace  Specimens were sent to Pathology for analysis.  See procedure note under Imaging tab in Epic for full procedure details.  Coralynn Gaona S Avenir Lozinski PA-C 03/02/2020 8:45 AM

## 2020-03-03 LAB — CYTOLOGY - NON PAP

## 2020-05-18 ENCOUNTER — Other Ambulatory Visit: Payer: Self-pay | Admitting: *Deleted

## 2020-05-18 DIAGNOSIS — D329 Benign neoplasm of meninges, unspecified: Secondary | ICD-10-CM

## 2020-06-09 ENCOUNTER — Telehealth: Payer: Self-pay | Admitting: Radiation Therapy

## 2020-06-09 ENCOUNTER — Other Ambulatory Visit: Payer: Self-pay | Admitting: Radiation Therapy

## 2020-06-09 NOTE — Telephone Encounter (Signed)
Spoke with pt about her upcoming brain MRI and follow-up with Dr. Mickeal Skinner in March. She is actually returning from [place] on 3/3, but was ok with having the MRI on 3/4 as scheduled.   Mont Dutton R.T.(R)(T) Radiation Special Procedures Navigator

## 2020-07-07 ENCOUNTER — Ambulatory Visit: Payer: BC Managed Care – PPO | Admitting: Internal Medicine

## 2020-07-13 ENCOUNTER — Other Ambulatory Visit: Payer: BC Managed Care – PPO

## 2020-07-14 ENCOUNTER — Other Ambulatory Visit: Payer: Self-pay

## 2020-07-14 ENCOUNTER — Ambulatory Visit
Admission: RE | Admit: 2020-07-14 | Discharge: 2020-07-14 | Disposition: A | Discharge status: Home / Self Care | Payer: BC Managed Care – PPO | Source: Ambulatory Visit | Attending: Internal Medicine | Admitting: Internal Medicine

## 2020-07-14 DIAGNOSIS — D329 Benign neoplasm of meninges, unspecified: Secondary | ICD-10-CM

## 2020-07-14 MED ORDER — GADOBENATE DIMEGLUMINE 529 MG/ML IV SOLN
18.0000 mL | Freq: Once | INTRAVENOUS | Status: AC | PRN
  Administered 2020-07-14: 18 mL via INTRAVENOUS

## 2020-07-17 ENCOUNTER — Inpatient Hospital Stay: Payer: BC Managed Care – PPO | Attending: Internal Medicine

## 2020-07-17 DIAGNOSIS — Z87891 Personal history of nicotine dependence: Secondary | ICD-10-CM | POA: Insufficient documentation

## 2020-07-17 DIAGNOSIS — Z79899 Other long term (current) drug therapy: Secondary | ICD-10-CM | POA: Insufficient documentation

## 2020-07-17 DIAGNOSIS — D329 Benign neoplasm of meninges, unspecified: Secondary | ICD-10-CM | POA: Insufficient documentation

## 2020-07-20 ENCOUNTER — Other Ambulatory Visit: Payer: Self-pay

## 2020-07-20 ENCOUNTER — Inpatient Hospital Stay (HOSPITAL_BASED_OUTPATIENT_CLINIC_OR_DEPARTMENT_OTHER): Payer: BC Managed Care – PPO | Admitting: Internal Medicine

## 2020-07-20 VITALS — BP 158/97 | HR 68 | Temp 97.3°F | Resp 18 | Wt 213.3 lb

## 2020-07-20 DIAGNOSIS — D329 Benign neoplasm of meninges, unspecified: Secondary | ICD-10-CM

## 2020-07-20 DIAGNOSIS — Z79899 Other long term (current) drug therapy: Secondary | ICD-10-CM | POA: Diagnosis not present

## 2020-07-20 DIAGNOSIS — Z87891 Personal history of nicotine dependence: Secondary | ICD-10-CM | POA: Diagnosis not present

## 2020-07-20 NOTE — Progress Notes (Signed)
[facility] at [place], [place] [address] [phone]   Interval Evaluation  Date of Service: 07/20/20 Patient Name: [name] Patient MRN: [id number] Patient DOB: Sep 21, 1965 Provider: Ventura Sellers, MD  Identifying Statement:  [name] is a 55 y.o. female with skull base meningioma   Referring Provider: Ocie Bob, [place] [address]  Oncologic History: 08/30/09: Workup for hyperprolactinemia lead to MRI brain, demonstrates likely meningioma in skull base and cavernous sinus 09/25/16: Clinical and radiographic progression prompt craniotomy, debulking resection by Dr. Christella Noa.  Path WHO grade I meningioma 02/17/17: Completes IMRT with Dr. Tammi Klippel, 54 Gy in 30 fractions   Interval History:  [name] returns for follow up after recent MRI brain. Vision and facial sensory impairments are stable.  She denies new or progressive neurologic deficits today.  Remains active, recently traveled to [place] to visit family there.     Medications: Current Outpatient Medications on File Prior to Visit  Medication Sig Dispense Refill  . benazepril (LOTENSIN) 10 MG tablet Take 10 mg by mouth daily.    . Cholecalciferol (VITAMIN D PO) Take 1 capsule by mouth daily.    . Cyanocobalamin (VITAMIN B 12 PO) Take 1 tablet by mouth daily.    Marland Kitchen ibuprofen (ADVIL,MOTRIN) 600 MG tablet Take 600 mg by mouth every 6 (six) hours as needed (for pain.).    Marland Kitchen metFORMIN (GLUCOPHAGE-XR) 500 MG 24 hr tablet Take 500 mg by mouth 2 (two) times daily.    . Multiple Vitamin (MULITIVITAMIN WITH MINERALS) TABS Take 1 tablet by mouth daily.     . pravastatin (PRAVACHOL) 20 MG tablet Take 20 mg by mouth daily.     No current facility-administered medications on file prior to visit.    Allergies:  Allergies  Allergen Reactions  . No Known Allergies   . Other     All Meat products, pt is strictly a vegetarian     Past Medical History:  Past Medical History:  Diagnosis Date  . 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 Diabetes mellitus without complication ([place])   . Dysrhythmia    PT STATES SHE HAS AN EXTRA HEART BEAT AT TIMES--FIRST NOTICED BY HER GYN  DR. Cletis Media COUPLE OF YRS AGO--PT STATES SHE WAS SENT FOR STRESS TEST-TREADMILL.  -PT DOESN'T HAVE ANY OTHER INFORMATION.  Marland Kitchen Headache   . Hypertension    B/P ELEVATED LAST 3 DOCTOR VISITS--BUT NO PRIOR HX AND NOT ON B/P MEDS  . Loss of sensation    right frontal area  . Meningioma ([place])    HX OF ELEVATED PROLACTIN LEVELS-FOUND TO HAVE MENINGIOMA ADJACENT TO PITITUARY GLAND--NO OTHER PROBLEMS ASSOC WITH THE TUMOR--NO SURGERY NEEDED--PT SEES DR. [name] [facility] IN [place] ONCE A YEAR  . Neuromuscular disorder (HCC)    Loss of nerve sensation on right frontal of face  . Seizures ([place])    mini seizure  1 week after surgery: pt. wasn't aware she had on,delayed responses to communication,showed up on ct scan   Past Surgical History:  Past Surgical History:  Procedure Laterality Date  . BREAST LUMPECTOMY Left 12/11/2017   Procedure: BREAST LUMPECTOMY;  Surgeon: Coralie Keens, MD;  Location: [facility];  Service: General;  Laterality: Left;  . CESAREAN SECTION  06/1990  . CRANIOPLASTY Right 06/25/2017   Procedure: CRANIOPLASTY RIGHT PTERIONAL DEFECT;  Surgeon: Ashok Pall, MD;  Location: [facility];  Service: Neurosurgery;  Laterality: Right;  right  . CRANIOPLASTY N/A 08/22/2017   Procedure: CRANIOPLASTY;  Surgeon: Ashok Pall, MD;  Location: [facility];  Service: Neurosurgery;  Laterality: N/A;  right  . CRANIOTOMY Right 09/25/2016   Procedure: PTERIONAL CRANIOTOMY  FOR TUMOR;  Surgeon: Ashok Pall, MD;  Location: [facility];  Service: Neurosurgery;  Laterality: Right;  . CYST REMOVED BOTH HANDS  1997 OR 1998  . PARTIAL KNEE ARTHROPLASTY  06/24/2011   Procedure: UNICOMPARTMENTAL KNEE;  Surgeon: Mauri Pole, MD;  [facility];  Service: Orthopedics;  Laterality: Right;  . RIGHT KNEE ARTHROSCOPY  1984   Social History:  Social History   Socioeconomic History  . Marital status: Single    Spouse name: Not on file  . Number of children: Not on file  . Years of education: Not on file  . Highest education level: Not on file  Occupational History  . Not on file  Tobacco Use  . Smoking status: Former Smoker    Packs/day: 0.25    Years: 4.00    Pack years: 1.00    Types: Cigarettes    Quit date: 09/18/2010    Years since quitting: 9.8  . Smokeless tobacco: Never Used  . Tobacco comment: QUIT 3 YEARS AGO-ABOUT 2008--SOCIAL SMOKER ONLY  Vaping Use  . Vaping Use: Never used  Substance and Sexual Activity  . Alcohol use: No  . Drug use: No  . Sexual activity: Not Currently  Other Topics Concern  . Not on file  Social History Narrative  . Not on file   Social Determinants of Health   Financial Resource Strain: Not on file  Food Insecurity: Not on file  Transportation Needs: Not on file  Physical Activity: Not on file  Stress: Not on file  Social Connections: Not on file  Intimate Partner Violence: Not on file   Family History: No family history on file.  Review of Systems: Constitutional: Denies fevers, chills or abnormal weight loss Eyes: Per HPI Ears, nose, mouth, throat, and face: impaired taste sensation Respiratory: Denies cough, dyspnea or wheezes Cardiovascular: Denies palpitation, chest discomfort or lower extremity swelling Gastrointestinal:  Denies nausea, constipation, diarrhea GU: Denies dysuria or incontinence Skin: Denies abnormal skin rashes Neurological: Per HPI Musculoskeletal: Denies joint pain, back or neck discomfort. No decrease in ROM Behavioral/Psych: Denies anxiety, disturbance in thought content, and mood instability  Physical Exam: Vitals:   07/20/20 0917  BP: (!) 158/97  Pulse: 68  Resp: 18  Temp: (!) 97.3 F (36.3 C)  SpO2: 99%    KPS: 80. General: Alert, cooperative, pleasant, in no acute distress Head: Craniotomy scar noted, dry and intact. EENT: Right eye proptotic, sutured closed Lungs: Resp effort normal Cardiac: Regular rate and rhythm Abdomen: Soft, non-distended abdomen Skin: No rashes cyanosis or petechiae. Extremities: No clubbing or edema  Neurologic Exam: Mental Status: Awake, alert, attentive to examiner. Oriented to self and environment. Language is fluent with intact comprehension.  Cranial Nerves: Visual acuity is grossly normal. Visual fields are full. Extra-ocular movements intact in left eye. Face is symmetric, tongue midline.  Ptosis right eye. Impaired facial sensation in V1-V3 distributions Motor: Tone and bulk are normal. Power is full in both arms and legs. Reflexes are symmetric, no pathologic reflexes present. Intact finger to nose bilaterally Sensory: Intact to light touch and temperature Gait: Normal and tandem gait is normal.   Labs: I have reviewed the data as listed    Component  Value Date/Time   NA 140 12/05/2017 0930   K 4.7 12/05/2017 0930   CL 104 12/05/2017 0930   CO2 25 12/05/2017 0930   GLUCOSE 111 (H) 12/05/2017 0930   BUN 6 12/05/2017 0930   CREATININE 0.90 12/05/2017 0930   CALCIUM 9.2 12/05/2017 0930   PROT 7.6 10/03/2016 0247   ALBUMIN 3.7 10/03/2016 0247   AST 27 10/03/2016 0247   ALT 22 10/03/2016 0247   ALKPHOS 75 10/03/2016 0247   BILITOT 1.3 (H) 10/03/2016 0247   GFRNONAA >60 12/05/2017 0930   GFRAA >60 12/05/2017 0930   Lab Results  Component Value Date   WBC 4.5 08/15/2017   NEUTROABS 2.7 06/18/2011   HGB 13.1 08/15/2017   HCT 42.4 08/15/2017   MCV 87.1 08/15/2017   PLT 279 08/15/2017    Imaging: [facility] Clinician Interpretation: I have personally reviewed the CNS images as listed.  My interpretation, in the context of the patient's clinical presentation, is stable disease  MR Brain W Wo Contrast  Result Date: 07/14/2020 CLINICAL DATA:   Yearly follow-up of meningioma. EXAM: MRI HEAD WITHOUT AND WITH CONTRAST TECHNIQUE: Multiplanar, multiecho pulse sequences of the brain and surrounding structures were obtained without and with intravenous contrast. CONTRAST:  44mL MULTIHANCE GADOBENATE DIMEGLUMINE 529 MG/ML IV SOLN COMPARISON:  07/09/2019. Multiple prior exams as distant as 03/19/2018. FINDINGS: Brain: Diffusion imaging does not show any acute or subacute infarction or other cause of restricted diffusion. Brainstem and cerebellum are normal. Cerebral hemispheres again show atrophy and gliosis of the right temporal tip subsequent to previous surgery. The brain does not show a pattern of small-vessel disease. No intra-axial mass lesion. No hydrocephalus. No extra-axial fluid collection. Stable residual meningioma as seen previously. Tumor is present along the clivus and the right tentorium. Tumor is present along the medial margin of the middle cranial fossa on the right, filling of the right cavernous sinus and sella turcica. Tumor fills the foramina ovale on the right and extends to the orbital apex. No evidence of tumor growth or new extension. Vascular: Right cavernous carotid is encased by tumor in somewhat narrowed but does continue to show flow. Skull and upper cervical spine: Previous right pterional craniotomy for tumor resection Sinuses/Orbits: Clear/normal Other: Small amount of mastoid air cell fluid on the right. IMPRESSION: Residual meningioma is unchanged compared to studies as distant as 2019, present at the skull base on the right along the medial margin of the middle cranial fossa on the right, filling the right cavernous sinus and sella turcica, extending along the clivus and right tentorium as detailed above. Electronically Signed   By: Nelson Chimes M.D.   On: 07/14/2020 15:51     Assessment/Plan Meningioma [facility])   We appreciate the opportunity to participate in the care of [facility].  She is clinically and  radiographically stable today.  We ask that Taressa Rauh return to clinic in 12 months following next brain MRI, or sooner as needed.   All questions were answered. The patient knows to call the clinic with any problems, questions or concerns. No barriers to learning were detected.  I have spent a total of 30 minutes of face-to-face and non-face-to-face time, excluding clinical staff time, preparing to see patient, ordering tests and/or medications, counseling the patient, and independently interpreting results and communicating results to the patient/family/caregiver    Ventura Sellers, MD Medical Director of Neuro-Oncology [facility] at [place] 07/20/20 9:11 AM

## 2020-07-28 ENCOUNTER — Other Ambulatory Visit: Payer: Self-pay | Admitting: Radiation Therapy

## 2021-02-15 ENCOUNTER — Other Ambulatory Visit: Payer: Self-pay | Admitting: Endocrinology

## 2021-02-15 DIAGNOSIS — E041 Nontoxic single thyroid nodule: Secondary | ICD-10-CM

## 2021-02-20 ENCOUNTER — Other Ambulatory Visit: Payer: BC Managed Care – PPO

## 2021-02-21 ENCOUNTER — Ambulatory Visit
Admission: RE | Admit: 2021-02-21 | Discharge: 2021-02-21 | Disposition: A | Discharge status: Home / Self Care | Payer: BC Managed Care – PPO | Source: Ambulatory Visit | Attending: Endocrinology | Admitting: Endocrinology

## 2021-02-21 DIAGNOSIS — E041 Nontoxic single thyroid nodule: Secondary | ICD-10-CM

## 2021-07-13 ENCOUNTER — Ambulatory Visit
Admission: RE | Admit: 2021-07-13 | Discharge: 2021-07-13 | Disposition: A | Discharge status: Home / Self Care | Payer: BC Managed Care – PPO | Source: Ambulatory Visit | Attending: Internal Medicine | Admitting: Internal Medicine

## 2021-07-13 ENCOUNTER — Other Ambulatory Visit: Payer: Self-pay

## 2021-07-13 DIAGNOSIS — D329 Benign neoplasm of meninges, unspecified: Secondary | ICD-10-CM

## 2021-07-13 MED ORDER — GADOBENATE DIMEGLUMINE 529 MG/ML IV SOLN
20.0000 mL | Freq: Once | INTRAVENOUS | Status: AC | PRN
  Administered 2021-07-13: 20 mL via INTRAVENOUS

## 2021-07-16 ENCOUNTER — Inpatient Hospital Stay: Payer: BC Managed Care – PPO | Attending: Internal Medicine

## 2021-07-16 DIAGNOSIS — Z87891 Personal history of nicotine dependence: Secondary | ICD-10-CM | POA: Insufficient documentation

## 2021-07-16 DIAGNOSIS — D329 Benign neoplasm of meninges, unspecified: Secondary | ICD-10-CM | POA: Insufficient documentation

## 2021-07-16 DIAGNOSIS — G9389 Other specified disorders of brain: Secondary | ICD-10-CM | POA: Insufficient documentation

## 2021-07-16 DIAGNOSIS — Z79899 Other long term (current) drug therapy: Secondary | ICD-10-CM | POA: Insufficient documentation

## 2021-07-17 ENCOUNTER — Inpatient Hospital Stay (HOSPITAL_BASED_OUTPATIENT_CLINIC_OR_DEPARTMENT_OTHER): Payer: BC Managed Care – PPO | Admitting: Internal Medicine

## 2021-07-17 ENCOUNTER — Other Ambulatory Visit: Payer: Self-pay

## 2021-07-17 VITALS — BP 153/90 | HR 81 | Temp 97.9°F | Resp 19 | Ht 70.0 in | Wt 211.7 lb

## 2021-07-17 DIAGNOSIS — Z87891 Personal history of nicotine dependence: Secondary | ICD-10-CM | POA: Diagnosis not present

## 2021-07-17 DIAGNOSIS — G9389 Other specified disorders of brain: Secondary | ICD-10-CM | POA: Diagnosis not present

## 2021-07-17 DIAGNOSIS — Z79899 Other long term (current) drug therapy: Secondary | ICD-10-CM | POA: Diagnosis not present

## 2021-07-17 DIAGNOSIS — D329 Benign neoplasm of meninges, unspecified: Secondary | ICD-10-CM

## 2021-07-17 NOTE — Progress Notes (Signed)
[facility] at [place], [place] [address] [phone]   Interval Evaluation  Date of Service: 07/17/21 Patient Name: [name] Patient MRN: [id number] Patient DOB: 04-09-1966 Provider: Ventura Sellers, MD  Identifying Statement:  [name] is a 56 y.o. female with  skull base  meningioma   Referring Provider: Ocie Bob, [place] [address]  Oncologic History: 08/30/09: Workup for hyperprolactinemia lead to MRI brain, demonstrates likely meningioma in skull base and cavernous sinus 09/25/16: Clinical and radiographic progression prompt craniotomy, debulking resection by Dr. Christella Noa.  Path WHO grade I meningioma 02/17/17: Completes IMRT with Dr. Tammi Klippel, 54 Gy in 30 fractions   Interval History:  [name] returns for follow up after recent MRI brain. No changes in visual issues or facial numbness.  She otherwise denies new or progressive neurologic deficits today.  Remains active physically and mentally.     Medications: Current Outpatient Medications on File Prior to Visit  Medication Sig Dispense Refill   benazepril (LOTENSIN) 10 MG tablet Take 10 mg by mouth daily.     Cholecalciferol (VITAMIN D PO) Take 1 capsule by mouth daily.     Cyanocobalamin (VITAMIN B 12 PO) Take 1 tablet by mouth daily.     ibuprofen (ADVIL,MOTRIN) 600 MG tablet Take 600 mg by mouth every 6 (six) hours as needed (for pain.).     metFORMIN (GLUCOPHAGE-XR) 500 MG 24 hr tablet Take 500 mg by mouth 2 (two) times daily.     Multiple Vitamin (MULITIVITAMIN WITH MINERALS) TABS Take 1 tablet by mouth daily.      pravastatin (PRAVACHOL) 20 MG tablet Take 20 mg by mouth daily.     No current facility-administered medications on file prior to visit.    Allergies:  Allergies  Allergen Reactions   No Known Allergies    Other     All Meat products, pt is strictly a vegetarian    Past Medical History:   Past Medical History:  Diagnosis Date   Arthritis    PAIN AND OA RIGHT KNEE--ALSO IN LEFT KNEE--RIGHT PAIN  WORSE   Diabetes mellitus without complication ([place])    Dysrhythmia    PT STATES SHE HAS AN EXTRA HEART BEAT AT TIMES--FIRST NOTICED BY HER GYN  DR. Cletis Media COUPLE OF YRS AGO--PT STATES SHE WAS SENT FOR STRESS TEST-TREADMILL.  -PT DOESN'T HAVE ANY OTHER INFORMATION.   Headache    Hypertension    B/P ELEVATED LAST 3 DOCTOR VISITS--BUT NO PRIOR HX AND NOT ON B/P MEDS   Loss of sensation    right frontal area   Meningioma (HCC)    HX OF ELEVATED PROLACTIN LEVELS-FOUND TO HAVE MENINGIOMA ADJACENT TO PITITUARY GLAND--NO OTHER PROBLEMS ASSOC WITH THE TUMOR--NO SURGERY NEEDED--PT SEES DR. [name] [facility]ELL IN [place] ONCE A YEAR   Neuromuscular disorder ([name])    Loss of nerve sensation on right frontal of face   Seizures (HCC)    mini seizure  1 week after surgery: pt. wasn't aware she had on,delayed responses to communication,showed up on ct scan   Past Surgical History:  Past Surgical History:  Procedure Laterality Date   BREAST LUMPECTOMY Left 12/11/2017   Procedure: BREAST LUMPECTOMY;  Surgeon: Coralie Keens, MD;  Location: [facility];  Service: General;  Laterality: Left;   CESAREAN SECTION  06/1990   CRANIOPLASTY Right 06/25/2017   Procedure: CRANIOPLASTY RIGHT PTERIONAL DEFECT;  Surgeon: Ashok Pall, MD;  Location: [facility];  Service: Neurosurgery;  Laterality: Right;  right   CRANIOPLASTY N/A 08/22/2017   Procedure: CRANIOPLASTY;  Surgeon: Ashok Pall, MD;  Location: [facility];  Service: Neurosurgery;  Laterality: N/A;  right   CRANIOTOMY Right 09/25/2016   Procedure: PTERIONAL CRANIOTOMY  FOR TUMOR;  Surgeon: Ashok Pall, MD;  Location: [facility];  Service: Neurosurgery;  Laterality: Right;   CYST REMOVED BOTH HANDS  1997 OR 1998   PARTIAL KNEE ARTHROPLASTY  06/24/2011   Procedure: UNICOMPARTMENTAL KNEE;  Surgeon: Mauri Pole, MD;  Location: [facility];  Service:  Orthopedics;  Laterality: Right;   RIGHT KNEE ARTHROSCOPY  1984   Social History:  Social History   Socioeconomic History   Marital status: Single    Spouse name: Not on file   Number of children: Not on file   Years of education: Not on file   Highest education level: Not on file  Occupational History   Not on file  Tobacco Use   Smoking status: Former    Packs/day: 0.25    Years: 4.00    Pack years: 1.00    Types: Cigarettes    Quit date: 09/18/2010    Years since quitting: 10.8   Smokeless tobacco: Never   Tobacco comments:    QUIT 3 YEARS AGO-ABOUT 2008--SOCIAL SMOKER ONLY  Vaping Use   Vaping Use: Never used  Substance and Sexual Activity   Alcohol use: No   Drug use: No   Sexual activity: Not Currently  Other Topics Concern   Not on file  Social History Narrative   Not on file   Social Determinants of Health   Financial Resource Strain: Not on file  Food Insecurity: Not on file  Transportation Needs: Not on file  Physical Activity: Not on file  Stress: Not on file  Social Connections: Not on file  Intimate Partner Violence: Not on file   Family History: No family history on file.  Review of Systems: Constitutional: Denies fevers, chills or abnormal weight loss Eyes: Per HPI Ears, nose, mouth, throat, and face: impaired taste sensation Respiratory: Denies cough, dyspnea or wheezes Cardiovascular: Denies palpitation, chest discomfort or lower extremity swelling Gastrointestinal:  Denies nausea, constipation, diarrhea GU: Denies dysuria or incontinence Skin: Denies abnormal skin rashes Neurological: Per HPI Musculoskeletal: Denies joint pain, back or neck discomfort. No decrease in ROM Behavioral/Psych: Denies anxiety, disturbance in thought content, and mood instability  Physical Exam: Vitals:   07/17/21 0921  BP: (!) 153/90  Pulse: 81  Resp: 19  Temp: 97.9 F (36.6 C)  SpO2: 100%    KPS: 80. General: Alert, cooperative, pleasant, in no acute  distress Head: Craniotomy scar noted, dry and intact. EENT: Right eye proptotic, sutured closed Lungs: Resp effort normal Cardiac: Regular rate and rhythm Abdomen: Soft, non-distended abdomen Skin: No rashes cyanosis or petechiae. Extremities: No clubbing or edema  Neurologic Exam: Mental Status: Awake, alert, attentive to examiner. Oriented to self and environment. Language is fluent with intact comprehension.  Cranial Nerves: Visual acuity is grossly normal. Visual fields are full. Extra-ocular movements intact in left eye. Face is symmetric, tongue midline.  Ptosis right eye. Impaired facial sensation in V1-V3 distributions Motor: Tone and bulk are normal. Power is full in both arms and legs. Reflexes are symmetric, no pathologic reflexes present. Intact finger to nose bilaterally Sensory: Intact to light touch and temperature Gait: Normal and tandem gait is normal.   Labs: I have reviewed the data as listed  Component Value Date/Time   NA 140 12/05/2017 0930   K 4.7 12/05/2017 0930   CL 104 12/05/2017 0930   CO2 25 12/05/2017 0930   GLUCOSE 111 (H) 12/05/2017 0930   BUN 6 12/05/2017 0930   CREATININE 0.90 12/05/2017 0930   CALCIUM 9.2 12/05/2017 0930   PROT 7.6 10/03/2016 0247   ALBUMIN 3.7 10/03/2016 0247   AST 27 10/03/2016 0247   ALT 22 10/03/2016 0247   ALKPHOS 75 10/03/2016 0247   BILITOT 1.3 (H) 10/03/2016 0247   GFRNONAA >60 12/05/2017 0930   GFRAA >60 12/05/2017 0930   Lab Results  Component Value Date   WBC 4.5 08/15/2017   NEUTROABS 2.7 06/18/2011   HGB 13.1 08/15/2017   HCT 42.4 08/15/2017   MCV 87.1 08/15/2017   PLT 279 08/15/2017    Imaging: [facility] Clinician Interpretation: I have personally reviewed the CNS images as listed.  My interpretation, in the context of the patient's clinical presentation, is stable disease  MR BRAIN W WO CONTRAST  Result Date: 07/13/2021 CLINICAL DATA:  Meningioma ([place]) D32.9 (ICD-10-CM). Brain/CNS neoplasm, assess  treatment response. EXAM: MRI HEAD WITHOUT AND WITH CONTRAST TECHNIQUE: Multiplanar, multiecho pulse sequences of the brain and surrounding structures were obtained without and with intravenous contrast. CONTRAST:  66m MULTIHANCE GADOBENATE DIMEGLUMINE 529 MG/ML IV SOLN COMPARISON:  MRI of the brain July 14, 2020 FINDINGS: Brain: No acute infarct, acute hemorrhage or hydrocephalus. Postsurgical changes from right pterional craniotomy with encephalomalacia and gliosis in the anterior right temporal lobe and inferior right frontal lobe. Residual right-sided skull base meningioma centered on the right cavernous sinus/petrous clival ligament extending into the right orbital apex, right paraclinoid region, sella, right cavernous sinus, right anterolateral aspect of the posterior fossa, involving the trigeminal nerve and extending into the Meckel's cave. The lesion appears similar in size and signal characteristics when compared to prior MRI. Vascular: The right internal carotid artery is encased by the above described lesion with decrease caliber of the persistent flow void. The lesion also partially involves the basilar artery which has preserved caliber and flow void. Skull and upper cervical spine: Prior right pterional cranioplasty. Sinuses/Orbits: Mild mucosal thickening of the right sphenoid sinus. Tumor extension into the right orbital apex. Other: None. IMPRESSION: Stable right sided skull base meningioma, as described above. Electronically Signed   By: KPedro EarlsM.D.   On: 07/13/2021 12:28      Assessment/Plan Meningioma ([facility]   We appreciate the opportunity to participate in the care of [facility]  She is clinically and radiographically stable today.  We ask that FWendelin Readerreturn to clinic in 12 months following next brain MRI, or sooner as needed.   All questions were answered. The patient knows to call the clinic with any problems, questions or concerns. No barriers  to learning were detected.  I have spent a total of 30 minutes of face-to-face and non-face-to-face time, excluding clinical staff time, preparing to see patient, ordering tests and/or medications, counseling the patient, and independently interpreting results and communicating results to the patient/family/caregiver    ZVentura Sellers MD Medical Director of Neuro-Oncology [facility]03/07/23 9:22 AM

## 2021-07-18 ENCOUNTER — Other Ambulatory Visit: Payer: Self-pay | Admitting: Radiation Therapy

## 2021-07-20 ENCOUNTER — Ambulatory Visit: Payer: BC Managed Care – PPO | Admitting: Internal Medicine

## 2022-02-15 ENCOUNTER — Other Ambulatory Visit: Payer: Self-pay | Admitting: Endocrinology

## 2022-02-15 DIAGNOSIS — E041 Nontoxic single thyroid nodule: Secondary | ICD-10-CM

## 2022-03-18 ENCOUNTER — Other Ambulatory Visit: Payer: BC Managed Care – PPO

## 2022-04-22 ENCOUNTER — Ambulatory Visit
Admission: RE | Admit: 2022-04-22 | Discharge: 2022-04-22 | Disposition: A | Discharge status: Home / Self Care | Payer: BC Managed Care – PPO | Source: Ambulatory Visit | Attending: Endocrinology | Admitting: Endocrinology

## 2022-04-22 DIAGNOSIS — E041 Nontoxic single thyroid nodule: Secondary | ICD-10-CM

## 2022-07-09 ENCOUNTER — Telehealth: Payer: Self-pay | Admitting: Internal Medicine

## 2022-07-09 ENCOUNTER — Other Ambulatory Visit: Payer: Self-pay | Admitting: *Deleted

## 2022-07-09 DIAGNOSIS — D329 Benign neoplasm of meninges, unspecified: Secondary | ICD-10-CM

## 2022-07-09 NOTE — Telephone Encounter (Signed)
Rescheduled 03/04 appointment to 03/12 due to patient's request and her unable to receive the MRI scan.

## 2022-07-12 ENCOUNTER — Other Ambulatory Visit: Payer: BC Managed Care – PPO

## 2022-07-15 ENCOUNTER — Ambulatory Visit: Payer: BC Managed Care – PPO | Admitting: Internal Medicine

## 2022-07-16 ENCOUNTER — Other Ambulatory Visit: Payer: Self-pay | Admitting: Radiation Therapy

## 2022-07-18 ENCOUNTER — Other Ambulatory Visit: Payer: BC Managed Care – PPO

## 2022-07-18 ENCOUNTER — Ambulatory Visit
Admission: RE | Admit: 2022-07-18 | Discharge: 2022-07-18 | Disposition: A | Discharge status: Home / Self Care | Payer: BC Managed Care – PPO | Source: Ambulatory Visit | Attending: Internal Medicine | Admitting: Internal Medicine

## 2022-07-18 DIAGNOSIS — D329 Benign neoplasm of meninges, unspecified: Secondary | ICD-10-CM

## 2022-07-18 MED ORDER — GADOPICLENOL 0.5 MMOL/ML IV SOLN
10.0000 mL | Freq: Once | INTRAVENOUS | Status: AC | PRN
  Administered 2022-07-18: 10 mL via INTRAVENOUS

## 2022-07-22 ENCOUNTER — Inpatient Hospital Stay: Payer: BC Managed Care – PPO | Attending: Internal Medicine

## 2022-07-22 DIAGNOSIS — E119 Type 2 diabetes mellitus without complications: Secondary | ICD-10-CM | POA: Insufficient documentation

## 2022-07-22 DIAGNOSIS — D320 Benign neoplasm of cerebral meninges: Secondary | ICD-10-CM | POA: Insufficient documentation

## 2022-07-22 DIAGNOSIS — G9389 Other specified disorders of brain: Secondary | ICD-10-CM | POA: Insufficient documentation

## 2022-07-22 DIAGNOSIS — Z79899 Other long term (current) drug therapy: Secondary | ICD-10-CM | POA: Insufficient documentation

## 2022-07-22 DIAGNOSIS — Z87891 Personal history of nicotine dependence: Secondary | ICD-10-CM | POA: Insufficient documentation

## 2022-07-23 ENCOUNTER — Other Ambulatory Visit: Payer: Self-pay

## 2022-07-23 ENCOUNTER — Inpatient Hospital Stay (HOSPITAL_BASED_OUTPATIENT_CLINIC_OR_DEPARTMENT_OTHER): Payer: BC Managed Care – PPO | Admitting: Internal Medicine

## 2022-07-23 VITALS — BP 172/94 | HR 62 | Temp 97.7°F | Resp 13 | Wt 212.6 lb

## 2022-07-23 DIAGNOSIS — D320 Benign neoplasm of cerebral meninges: Secondary | ICD-10-CM | POA: Diagnosis not present

## 2022-07-23 DIAGNOSIS — E119 Type 2 diabetes mellitus without complications: Secondary | ICD-10-CM | POA: Diagnosis not present

## 2022-07-23 DIAGNOSIS — Z87891 Personal history of nicotine dependence: Secondary | ICD-10-CM | POA: Diagnosis not present

## 2022-07-23 DIAGNOSIS — D329 Benign neoplasm of meninges, unspecified: Secondary | ICD-10-CM | POA: Diagnosis not present

## 2022-07-23 DIAGNOSIS — G9389 Other specified disorders of brain: Secondary | ICD-10-CM | POA: Diagnosis not present

## 2022-07-23 DIAGNOSIS — Z79899 Other long term (current) drug therapy: Secondary | ICD-10-CM | POA: Diagnosis not present

## 2022-07-23 NOTE — Progress Notes (Signed)
[facility] at [place], [place] [address] [phone]   Interval Evaluation  Date of Service: 07/23/22 Patient Name: [name] Patient MRN: [id number] Patient DOB: 01/12/1966 Provider: Ventura Sellers, MD  Identifying Statement:  [name] is a 57 y.o. female with  skull base  meningioma   Referring Provider: Ocie Bob, [place] [address]  Oncologic History: 08/30/09: Workup for hyperprolactinemia lead to MRI brain, demonstrates likely meningioma in skull base and cavernous sinus 09/25/16: Clinical and radiographic progression prompt craniotomy, debulking resection by Dr. Christella Noa.  Path WHO grade I meningioma 02/17/17: Completes IMRT with Dr. Tammi Klippel, 54 Gy in 30 fractions   Interval History:  [name] returns for follow up after recent MRI brain. No changes in visual issues or facial numbness.  She otherwise denies new or progressive neurologic deficits today.  Continues to work full time.  Medications: Current Outpatient Medications on File Prior to Visit  Medication Sig Dispense Refill   benazepril (LOTENSIN) 10 MG tablet Take 10 mg by mouth daily.     Cholecalciferol (VITAMIN D PO) Take 1 capsule by mouth daily.     Cyanocobalamin (VITAMIN B 12 PO) Take 1 tablet by mouth daily.     ibuprofen (ADVIL,MOTRIN) 600 MG tablet Take 600 mg by mouth every 6 (six) hours as needed (for pain.).     Multiple Vitamin (MULITIVITAMIN WITH MINERALS) TABS Take 1 tablet by mouth daily.      pravastatin (PRAVACHOL) 20 MG tablet Take 20 mg by mouth daily.     SYNJARDY XR 25-1000 MG TB24 Take 1 tablet by mouth daily.     No current facility-administered medications on file prior to visit.    Allergies:  Allergies  Allergen Reactions   No Known Allergies    Other     All Meat products, pt is strictly a vegetarian    Past Medical History:  Past Medical History:  Diagnosis Date    Arthritis    PAIN AND OA RIGHT KNEE--ALSO IN LEFT KNEE--RIGHT PAIN  WORSE   Diabetes mellitus without complication ([place])    Dysrhythmia    PT STATES SHE HAS AN EXTRA HEART BEAT AT TIMES--FIRST NOTICED BY HER GYN  DR. Cletis Media COUPLE OF YRS AGO--PT STATES SHE WAS SENT FOR STRESS TEST-TREADMILL.  -PT DOESN'T HAVE ANY OTHER INFORMATION.   Headache    Hypertension    B/P ELEVATED LAST 3 DOCTOR VISITS--BUT NO PRIOR HX AND NOT ON B/P MEDS   Loss of sensation    right frontal area   Meningioma (HCC)    HX OF ELEVATED PROLACTIN LEVELS-FOUND TO HAVE MENINGIOMA ADJACENT TO PITITUARY GLAND--NO OTHER PROBLEMS ASSOC WITH THE TUMOR--NO SURGERY NEEDED--PT SEES DR. [name] [facility]ELL IN [place] ONCE A YEAR   Neuromuscular disorder ([place])    Loss of nerve sensation on right frontal of face   Seizures (HCC)    mini seizure  1 week after surgery: pt. wasn't aware she had on,delayed responses to communication,showed up on ct scan   Past Surgical History:  Past Surgical History:  Procedure Laterality Date   BREAST LUMPECTOMY Left 12/11/2017   Procedure: BREAST LUMPECTOMY;  Surgeon: Coralie Keens, MD;  Location: [facility];  Service: General;  Laterality: Left;   CESAREAN SECTION  06/1990   CRANIOPLASTY Right 06/25/2017   Procedure: CRANIOPLASTY RIGHT PTERIONAL DEFECT;  Surgeon: Ashok Pall, MD;  Location: [facility];  Service: Neurosurgery;  Laterality:  Right;  right   CRANIOPLASTY N/A 08/22/2017   Procedure: CRANIOPLASTY;  Surgeon: Ashok Pall, MD;  Location: [facility];  Service: Neurosurgery;  Laterality: N/A;  right   CRANIOTOMY Right 09/25/2016   Procedure: PTERIONAL CRANIOTOMY  FOR TUMOR;  Surgeon: Ashok Pall, MD;  Location: [facility];  Service: Neurosurgery;  Laterality: Right;   CYST REMOVED BOTH HANDS  1997 OR 1998   PARTIAL KNEE ARTHROPLASTY  06/24/2011   Procedure: UNICOMPARTMENTAL KNEE;  Surgeon: Mauri Pole, MD;  [facility];  Service: Orthopedics;  Laterality: Right;   RIGHT KNEE  ARTHROSCOPY  1984   Social History:  Social History   Socioeconomic History   Marital status: Single    Spouse name: Not on file   Number of children: Not on file   Years of education: Not on file   Highest education level: Not on file  Occupational History   Not on file  Tobacco Use   Smoking status: Former    Packs/day: 0.25    Years: 4.00    Total pack years: 1.00    Types: Cigarettes    Quit date: 09/18/2010    Years since quitting: 11.8   Smokeless tobacco: Never   Tobacco comments:    QUIT 3 YEARS AGO-ABOUT 2008--SOCIAL SMOKER ONLY  Vaping Use   Vaping Use: Never used  Substance and Sexual Activity   Alcohol use: No   Drug use: No   Sexual activity: Not Currently  Other Topics Concern   Not on file  Social History Narrative   Not on file   Social Determinants of Health   Financial Resource Strain: Not on file  Food Insecurity: Not on file  Transportation Needs: Not on file  Physical Activity: Not on file  Stress: Not on file  Social Connections: Not on file  Intimate Partner Violence: Not on file   Family History: No family history on file.  Review of Systems: Constitutional: Denies fevers, chills or abnormal weight loss Eyes: Per HPI Ears, nose, mouth, throat, and face: impaired taste sensation Respiratory: Denies cough, dyspnea or wheezes Cardiovascular: Denies palpitation, chest discomfort or lower extremity swelling Gastrointestinal:  Denies nausea, constipation, diarrhea GU: Denies dysuria or incontinence Skin: Denies abnormal skin rashes Neurological: Per HPI Musculoskeletal: Denies joint pain, back or neck discomfort. No decrease in ROM Behavioral/Psych: Denies anxiety, disturbance in thought content, and mood instability  Physical Exam: Vitals:   07/23/22 0928  BP: (!) 172/94  Pulse: 62  Resp: 13  Temp: 97.7 F (36.5 C)  SpO2: 100%    KPS: 80. General: Alert, cooperative, pleasant, in no acute distress Head: Craniotomy scar noted,  dry and intact. EENT: Right eye proptotic, sutured closed Lungs: Resp effort normal Cardiac: Regular rate and rhythm Abdomen: Soft, non-distended abdomen Skin: No rashes cyanosis or petechiae. Extremities: No clubbing or edema  Neurologic Exam: Mental Status: Awake, alert, attentive to examiner. Oriented to self and environment. Language is fluent with intact comprehension.  Cranial Nerves: Visual acuity is grossly normal. Visual fields are full. Extra-ocular movements intact in left eye. Face is symmetric, tongue midline.  Ptosis right eye. Impaired facial sensation in V1-V3 distributions Motor: Tone and bulk are normal. Power is full in both arms and legs. Reflexes are symmetric, no pathologic reflexes present. Intact finger to nose bilaterally Sensory: Intact to light touch and temperature Gait: Normal and tandem gait is normal.   Labs: I have reviewed the data as listed    Component Value Date/Time   NA 140  12/05/2017 0930   K 4.7 12/05/2017 0930   CL 104 12/05/2017 0930   CO2 25 12/05/2017 0930   GLUCOSE 111 (H) 12/05/2017 0930   BUN 6 12/05/2017 0930   CREATININE 0.90 12/05/2017 0930   CALCIUM 9.2 12/05/2017 0930   PROT 7.6 10/03/2016 0247   ALBUMIN 3.7 10/03/2016 0247   AST 27 10/03/2016 0247   ALT 22 10/03/2016 0247   ALKPHOS 75 10/03/2016 0247   BILITOT 1.3 (H) 10/03/2016 0247   GFRNONAA >60 12/05/2017 0930   GFRAA >60 12/05/2017 0930   Lab Results  Component Value Date   WBC 4.5 08/15/2017   NEUTROABS 2.7 06/18/2011   HGB 13.1 08/15/2017   HCT 42.4 08/15/2017   MCV 87.1 08/15/2017   PLT 279 08/15/2017    Imaging: [place] Clinician Interpretation: I have personally reviewed the CNS images as listed.  My interpretation, in the context of the patient's clinical presentation, is stable disease  MR Brain W Wo Contrast  Result Date: 07/19/2022 CLINICAL DATA:  Follow-up meningioma resected in 2017. EXAM: MRI HEAD WITHOUT AND WITH CONTRAST TECHNIQUE: Multiplanar,  multiecho pulse sequences of the brain and surrounding structures were obtained without and with intravenous contrast. CONTRAST:  10 cc [employer] COMPARISON:  Brain MRI 07/13/2021. FINDINGS: Brain: Postsurgical changes reflecting right frontotemporal craniotomy for meningioma resection are again seen. Mild dural thickening and enhancement at the craniotomy site is unchanged. Residual meningioma centered about the right sphenoid wing extension to the sella, along the floor of the middle cranial fossa with invasion of Meckel's cave and the cavernous sinus, right tentorial leaflet, right orbital apex, along the dorsal aspect of the clivus, and into the right sphenoid sinus appears unchanged. Tumor approaches the right porous acoustic [place] without evidence of extent into the IAC (19-42). Tumor again approaches the foramen rotundum and ovale without frank extent through the foramina. Encasement of the right intracranial ICA by tumor is unchanged. Encephalomalacia and gliosis in the right anterior temporal lobe is unchanged. There is no acute intracranial hemorrhage, extra-axial fluid collection, or acute infarct. Background parenchymal volume is normal. The ventricles are normal in size. Minimal background chronic small-vessel ischemic change is stable. Vascular: As above, the right intracranial ICA is encased by the residual meningioma with decreased caliber of the flow void, unchanged. Tumor also abuts the anterior aspect of the basilar artery without evidence of complete encasement. The other major flow voids are normal. Skull and upper cervical spine: Postsurgical changes as above. There is no suspicious marrow signal abnormality. Sinuses/Orbits: The paranasal sinuses are clear. The globes and orbits are unremarkable, aside from involvement of the right orbital apex by residual meningioma as above. Other: None. IMPRESSION: Stable residual meningioma centered about the right sphenoid wing with involvement of surrounding  structures as detailed above. Electronically Signed   By: Valetta Mole M.D.   On: 07/19/2022 13:20      Assessment/Plan Meningioma [facility])   We appreciate the opportunity to participate in the care of [facility].  She is clinically and radiographically stable today, no new or progressive changes.  We ask that Glorimar Teeling return to clinic in 18 months following next brain MRI, or sooner as needed.   All questions were answered. The patient knows to call the clinic with any problems, questions or concerns. No barriers to learning were detected.  I have spent a total of 30 minutes of face-to-face and non-face-to-face time, excluding clinical staff time, preparing to see patient, ordering tests and/or medications, counseling the patient,  and independently interpreting results and communicating results to the patient/family/caregiver    Ventura Sellers, MD Medical Director of Neuro-Oncology [facility] [place] 07/23/22 10:00 AM

## 2023-12-22 ENCOUNTER — Other Ambulatory Visit: Payer: Self-pay | Admitting: *Deleted

## 2023-12-22 DIAGNOSIS — D329 Benign neoplasm of meninges, unspecified: Secondary | ICD-10-CM

## 2024-01-01 ENCOUNTER — Telehealth: Payer: Self-pay | Admitting: Internal Medicine

## 2024-01-01 NOTE — Telephone Encounter (Signed)
 Scheduled appointment per staff message. Talked with the patient and she is aware of the made appointment.

## 2024-01-02 ENCOUNTER — Other Ambulatory Visit: Payer: Self-pay | Admitting: Endocrinology

## 2024-01-02 DIAGNOSIS — E041 Nontoxic single thyroid nodule: Secondary | ICD-10-CM

## 2024-01-13 ENCOUNTER — Inpatient Hospital Stay: Admission: RE | Admit: 2024-01-13 | Source: Ambulatory Visit

## 2024-01-15 ENCOUNTER — Ambulatory Visit
Admission: RE | Admit: 2024-01-15 | Discharge: 2024-01-15 | Disposition: A | Discharge status: Home / Self Care | Source: Ambulatory Visit | Attending: Endocrinology | Admitting: Endocrinology

## 2024-01-15 DIAGNOSIS — E041 Nontoxic single thyroid nodule: Secondary | ICD-10-CM

## 2024-01-23 ENCOUNTER — Ambulatory Visit
Admission: RE | Admit: 2024-01-23 | Discharge: 2024-01-23 | Disposition: A | Discharge status: Home / Self Care | Source: Ambulatory Visit | Attending: Internal Medicine | Admitting: Internal Medicine

## 2024-01-23 DIAGNOSIS — D329 Benign neoplasm of meninges, unspecified: Secondary | ICD-10-CM

## 2024-01-23 MED ORDER — GADOPICLENOL 0.5 MMOL/ML IV SOLN
9.0000 mL | Freq: Once | INTRAVENOUS | Status: AC | PRN
  Administered 2024-01-23: 9 mL via INTRAVENOUS

## 2024-01-27 ENCOUNTER — Inpatient Hospital Stay: Attending: Internal Medicine | Admitting: Internal Medicine

## 2024-01-27 VITALS — BP 136/77 | HR 63 | Temp 97.7°F | Resp 16 | Ht 70.0 in | Wt 200.0 lb

## 2024-01-27 DIAGNOSIS — Z86011 Personal history of benign neoplasm of the brain: Secondary | ICD-10-CM | POA: Insufficient documentation

## 2024-01-27 DIAGNOSIS — E041 Nontoxic single thyroid nodule: Secondary | ICD-10-CM | POA: Diagnosis not present

## 2024-01-27 DIAGNOSIS — G9389 Other specified disorders of brain: Secondary | ICD-10-CM | POA: Insufficient documentation

## 2024-01-27 DIAGNOSIS — Z79899 Other long term (current) drug therapy: Secondary | ICD-10-CM | POA: Insufficient documentation

## 2024-01-27 DIAGNOSIS — Z87891 Personal history of nicotine dependence: Secondary | ICD-10-CM | POA: Diagnosis not present

## 2024-01-27 DIAGNOSIS — D329 Benign neoplasm of meninges, unspecified: Secondary | ICD-10-CM | POA: Diagnosis not present

## 2024-01-27 NOTE — Progress Notes (Signed)
 [facility] at [facility] [address]  [place], [place] [address] [phone]   Interval Evaluation  Date of Service: 01/27/24 Patient Name: [name] Patient MRN: [id number] Patient DOB: [birth year] Provider: Arthea MARLA Manns, MD  Identifying Statement:  [name] is a 58 y.o. female with skull base meningioma   Referring Provider: Teresa Berwyn LABOR, FNP [address] [place],  [place] [address]  Oncologic History: 08/30/09: Workup for hyperprolactinemia lead to MRI brain, demonstrates likely meningioma in skull base and cavernous sinus 09/25/16: Clinical and radiographic progression prompt craniotomy, debulking resection by Dr. Gillie.  Path WHO grade I meningioma 02/17/17: Completes IMRT with Dr. Patrcia, 54 Gy in 30 fractions   Interval History:  [name] returns for follow up after recent MRI brain. Visual and sensory issues are stable overall.  She otherwise denies new or progressive neurologic deficits today.  Continues to work full time.  Medications: Current Outpatient Medications on File Prior to Visit  Medication Sig Dispense Refill   benazepril  (LOTENSIN ) 10 MG tablet Take 10 mg by mouth daily.     Cholecalciferol  (VITAMIN D  PO) Take 1 capsule by mouth daily.     Cyanocobalamin  (VITAMIN B 12 PO) Take 1 tablet by mouth daily.     ibuprofen (ADVIL,MOTRIN) 600 MG tablet Take 600 mg by mouth every 6 (six) hours as needed (for pain.).     Multiple Vitamin (MULITIVITAMIN WITH MINERALS) TABS Take 1 tablet by mouth daily.      pravastatin  (PRAVACHOL ) 20 MG tablet Take 20 mg by mouth daily.     SYNJARDY XR 25-1000 MG TB24 Take 1 tablet by mouth daily.     No current facility-administered medications on file prior to visit.    Allergies:  Allergies  Allergen Reactions   No Known Allergies    Other     All Meat products, pt is strictly a vegetarian    Past Medical History:  Past Medical History:  Diagnosis Date    Arthritis    PAIN AND OA RIGHT KNEE--ALSO IN LEFT KNEE--RIGHT PAIN  WORSE   Diabetes mellitus without complication (HCC)    Dysrhythmia    PT STATES SHE HAS AN EXTRA HEART BEAT AT TIMES--FIRST NOTICED BY HER GYN  DR. DARCEL COUPLE OF YRS AGO--PT STATES SHE WAS SENT FOR STRESS TEST-TREADMILL.  -PT DOESN'T HAVE ANY OTHER INFORMATION.   Headache    Hypertension    B/P ELEVATED LAST 3 DOCTOR VISITS--BUT NO PRIOR HX AND NOT ON B/P MEDS   Loss of sensation    right frontal area   Meningioma (HCC)    HX OF ELEVATED PROLACTIN LEVELS-FOUND TO HAVE MENINGIOMA ADJACENT TO PITITUARY GLAND--NO OTHER PROBLEMS ASSOC WITH THE TUMOR--NO SURGERY NEEDED--PT SEES DR. ELSIE CABELL IN [place] ONCE A YEAR   Neuromuscular disorder (HCC)    Loss of nerve sensation on right frontal of face   Seizures (HCC)    mini seizure  1 week after surgery: pt. wasn't aware she had on,delayed responses to communication,showed up on ct scan   Past Surgical History:  Past Surgical History:  Procedure Laterality Date   BREAST LUMPECTOMY Left 12/11/2017   Procedure: BREAST LUMPECTOMY;  Surgeon: Vernetta Berg, MD;  Location: [facility];  Service: General;  Laterality: Left;   CESAREAN SECTION  06/1990   CRANIOPLASTY Right 06/25/2017   Procedure: CRANIOPLASTY RIGHT PTERIONAL DEFECT;  Surgeon: Gillie Duncans, MD;  Location: [facility] OR;  Service: Neurosurgery;  Laterality: Right;  right  CRANIOPLASTY N/A 08/22/2017   Procedure: CRANIOPLASTY;  Surgeon: Gillie Duncans, MD;  Location: [facility] OR;  Service: Neurosurgery;  Laterality: N/A;  right   CRANIOTOMY Right 09/25/2016   Procedure: PTERIONAL CRANIOTOMY  FOR TUMOR;  Surgeon: Gillie Duncans, MD;  Location: [facility] OR;  Service: Neurosurgery;  Laterality: Right;   CYST REMOVED BOTH HANDS  1997 OR 1998   PARTIAL KNEE ARTHROPLASTY  06/24/2011   Procedure: UNICOMPARTMENTAL KNEE;  Surgeon: Donnice JONETTA Car, MD;  Location: [facility] ORS;  Service: Orthopedics;  Laterality: Right;   RIGHT KNEE  ARTHROSCOPY  1984   Social History:  Social History   Socioeconomic History   Marital status: Single    Spouse name: Not on file   Number of children: Not on file   Years of education: Not on file   Highest education level: Not on file  Occupational History   Not on file  Tobacco Use   Smoking status: Former    Current packs/day: 0.00    Average packs/day: 0.3 packs/day for 4.0 years (1.0 ttl pk-yrs)    Types: Cigarettes    Start date: 09/18/2006    Quit date: 09/18/2010    Years since quitting: 13.3   Smokeless tobacco: Never   Tobacco comments:    QUIT 3 YEARS AGO-ABOUT 2008--SOCIAL SMOKER ONLY  Vaping Use   Vaping status: Never Used  Substance and Sexual Activity   Alcohol use: No   Drug use: No   Sexual activity: Not Currently  Other Topics Concern   Not on file  Social History Narrative   Not on file   Social Drivers of Health   Financial Resource Strain: Not on file  Food Insecurity: Not on file  Transportation Needs: Not on file  Physical Activity: Not on file  Stress: Not on file  Social Connections: Not on file  Intimate Partner Violence: Not on file   Family History: No family history on file.  Review of Systems: Constitutional: Denies fevers, chills or abnormal weight loss Eyes: Per HPI Ears, nose, mouth, throat, and face: impaired taste sensation Respiratory: Denies cough, dyspnea or wheezes Cardiovascular: Denies palpitation, chest discomfort or lower extremity swelling Gastrointestinal:  Denies nausea, constipation, diarrhea GU: Denies dysuria or incontinence Skin: Denies abnormal skin rashes Neurological: Per HPI Musculoskeletal: Denies joint pain, back or neck discomfort. No decrease in ROM Behavioral/Psych: Denies anxiety, disturbance in thought content, and mood instability  Physical Exam: Vitals:   01/27/24 0945 01/27/24 0948  BP: (!) 154/94 136/77  Pulse: 63   Resp: 16   Temp: 97.7 F (36.5 C)   SpO2: 100%     KPS: 80. General:  Alert, cooperative, pleasant, in no acute distress Head: Craniotomy scar noted, dry and intact. EENT: Right eye proptotic, sutured closed Lungs: Resp effort normal Cardiac: Regular rate and rhythm Abdomen: Soft, non-distended abdomen Skin: No rashes cyanosis or petechiae. Extremities: No clubbing or edema  Neurologic Exam: Mental Status: Awake, alert, attentive to examiner. Oriented to self and environment. Language is fluent with intact comprehension.  Cranial Nerves: Visual acuity is grossly normal. Visual fields are full. Extra-ocular movements intact in left eye. Face is symmetric, tongue midline.  Ptosis right eye. Impaired facial sensation in V1-V3 distributions Motor: Tone and bulk are normal. Power is full in both arms and legs. Reflexes are symmetric, no pathologic reflexes present. Intact finger to nose bilaterally Sensory: Intact to light touch and temperature Gait: Normal and tandem gait is normal.   Labs: I have reviewed the data as listed  Component Value Date/Time   NA 140 12/05/2017 0930   K 4.7 12/05/2017 0930   CL 104 12/05/2017 0930   CO2 25 12/05/2017 0930   GLUCOSE 111 (H) 12/05/2017 0930   BUN 6 12/05/2017 0930   CREATININE 0.90 12/05/2017 0930   CALCIUM 9.2 12/05/2017 0930   PROT 7.6 10/03/2016 0247   ALBUMIN  3.7 10/03/2016 0247   AST 27 10/03/2016 0247   ALT 22 10/03/2016 0247   ALKPHOS 75 10/03/2016 0247   BILITOT 1.3 (H) 10/03/2016 0247   GFRNONAA >60 12/05/2017 0930   GFRAA >60 12/05/2017 0930   Lab Results  Component Value Date   WBC 4.5 08/15/2017   NEUTROABS 2.7 06/18/2011   HGB 13.1 08/15/2017   HCT 42.4 08/15/2017   MCV 87.1 08/15/2017   PLT 279 08/15/2017    Imaging: [facility] Clinician Interpretation: I have personally reviewed the CNS images as listed.  My interpretation, in the context of the patient's clinical presentation, is stable disease  MR Brain W Wo Contrast Result Date: 01/23/2024 CLINICAL DATA:  58 year old female with a  history of meningioma resection in 2017 or 2016. Restaging. EXAM: MRI HEAD WITHOUT AND WITH CONTRAST TECHNIQUE: Multiplanar, multiecho pulse sequences of the brain and surrounding structures were obtained without and with intravenous contrast. CONTRAST:  10 mL Vueway  COMPARISON:  MRI 07/18/2022 and earlier. FINDINGS: Brain: Chronic right frontotemporal craniotomy. Chronic homogeneously enhancing soft tissue and dura throughout the right cavernous sinus, contiguous onto the right tentorium tapering posteriorly, and onto retro clival dura tapering tapering inferiorly. Size and configuration stable compared to 2023 MRI (07/13/2021). The residual soft tissue measures up to 15 mm in thickness. Adjacent chronic encephalomalacia right anterior temporal tip is stable. Stable ex vacuo enlargement of the right temporal horn. More widespread but mild and postoperative appearing right convexity dural thickening and enhancement also stable since 2023. No cerebral edema. No midline shift or significant intracranial mass effect. No superimposed restricted diffusion to suggest acute infarction. No ventriculomegaly, acute intracranial hemorrhage. Cervicomedullary junction and pituitary are within normal limits. No new signal abnormality. No significant chronic cerebral blood products. Vascular: Major intracranial vascular flow voids are stable, including the right ICA siphon in the abnormal right cavernous sinus. Skull and upper cervical spine: Stable craniotomy. Background bone marrow signal remains normal. Negative visible cervical spine. Sinuses/Orbits: Chronic Disconjugate gaze. Paranasal sinuses and mastoids are stable and well aerated. Other: Dural thickening extending to the right porous acusticus appears stable since 2023. And otherwise the visible internal auditory structures appear grossly normal. Right temporalis muscle and other regional scalp postoperative changes are stable. IMPRESSION: 1. Continued stable post  treatment appearance of right skull base meningioma. No new or progressive features. 2. No new intracranial abnormality. Electronically Signed   By: VEAR Hurst M.D.   On: 01/23/2024 11:13   US  THYROID  Result Date: 01/21/2024 CLINICAL DATA:  Prior ultrasound follow-up. EXAM: THYROID  ULTRASOUND TECHNIQUE: Ultrasound examination of the thyroid  gland and adjacent soft tissues was performed. COMPARISON:  02/07/2020, 03/02/2020, 04/22/2022 FINDINGS: Parenchymal Echotexture: Mildly heterogeneous Isthmus: 0.5 cm ,previously 0.5 cm Right lobe: 4.4 x 1.7 x 1.4 cm ,previously 4.4 x 1.6 x 1.6 cm Left lobe: 6.6 x 3.2 x 4.6 cm ,previously 6.9 x 3.5 x 3.6 cm ________________________________________________________ Estimated total number of nodules >/= 1 cm: 2 Number of spongiform nodules >/=  2 cm not described below (TR1): 0 Number of mixed cystic and solid nodules >/= 1.5 cm not described below (TR2): 0 _________________________________________________________ Similar appearance of previously biopsied  right mid thyroid  nodule (labeled 1, 3.9 cm, previously 5.0 cm). Nodule # 2 (previously labeled 3): Prior biopsy: No Location: Left; Inferior, medial Maximum size: 4.0 cm; Other 2 dimensions: 3.3 x 3.0 cm, previously, 3.8 x 2.7 x 2.6 cm Composition: solid/almost completely solid (2) Echogenicity: isoechoic (1) Shape: not taller-than-wide (0) Margins: smooth (0) Echogenic foci: none (0) ACR TI-RADS total points: 3. ACR TI-RADS risk category:  TR3 (3 points). Significant change in size (>/= 20% in two dimensions and minimal increase of 2 mm): No Change in features: No Change in ACR TI-RADS risk category: No ACR TI-RADS recommendations: **Given size (>/= 2.5 cm) and appearance, fine needle aspiration of this mildly suspicious nodule should be considered based on TI-RADS criteria. _________________________________________________________ No cervical lymphadenopathy. IMPRESSION: 1. Slight interval enlargement of previously visualized  left inferior solid thyroid  nodule (labeled 2, 4.0 cm, previously 3.8 cm which meets criteria (TI-RADS category 3) for tissue sampling. Recommend ultrasound-guided fine-needle aspiration. 2. Continued decreased size of previously biopsied left mid solid thyroid  nodule (labeled 1, 3.9 cm, previously 5.0 cm). Recommend correlation with prior biopsy results. The above is in keeping with the ACR TI-RADS recommendations - J Am Coll Radiol 2017;14:587-595. Ester Sides, MD [facility] Electronically Signed   By: Ester Sides M.D.   On: 01/21/2024 10:01      Assessment/Plan Meningioma [facility])   We appreciate the opportunity to participate in the care of [facility].  MRI again demonstrates stable findings. No new or progressive clinical changes.  We ask that Mariza Bourget return to clinic in 18 months following next brain MRI, or sooner as needed.   All questions were answered. The patient knows to call the clinic with any problems, questions or concerns. No barriers to learning were detected.  I have spent a total of 30 minutes of face-to-face and non-face-to-face time, excluding clinical staff time, preparing to see patient, ordering tests and/or medications, counseling the patient, and independently interpreting results and communicating results to the patient/family/caregiver    Mikenna Bunkley K Agam Davenport, MD Medical Director of Neuro-Oncology [facility] at [place] [facility] 01/27/24 9:56 AM

## 2024-02-09 ENCOUNTER — Other Ambulatory Visit: Payer: Self-pay | Admitting: Endocrinology

## 2024-02-09 DIAGNOSIS — E041 Nontoxic single thyroid nodule: Secondary | ICD-10-CM

## 2024-03-01 ENCOUNTER — Other Ambulatory Visit (HOSPITAL_COMMUNITY)
Admission: RE | Admit: 2024-03-01 | Discharge: 2024-03-01 | Disposition: A | Discharge status: Home / Self Care | Source: Ambulatory Visit | Attending: Interventional Radiology | Admitting: Interventional Radiology

## 2024-03-01 ENCOUNTER — Ambulatory Visit
Admission: RE | Admit: 2024-03-01 | Discharge: 2024-03-01 | Disposition: A | Source: Ambulatory Visit | Attending: Endocrinology | Admitting: Endocrinology

## 2024-03-01 DIAGNOSIS — E041 Nontoxic single thyroid nodule: Secondary | ICD-10-CM | POA: Insufficient documentation

## 2024-03-03 LAB — CYTOLOGY - NON PAP
